# Patient Record
Sex: Male | Born: 1940 | Race: White | Hispanic: No | Marital: Married | Smoking: Never smoker
Health system: Southern US, Community
[De-identification: ages and names within clinical notes are randomized; demographics above are authoritative.]

## PROBLEM LIST (undated history)

## (undated) DIAGNOSIS — I714 Abdominal aortic aneurysm, without rupture, unspecified: Secondary | ICD-10-CM

## (undated) DIAGNOSIS — H5350 Unspecified color vision deficiencies: Secondary | ICD-10-CM

## (undated) DIAGNOSIS — E785 Hyperlipidemia, unspecified: Secondary | ICD-10-CM

## (undated) DIAGNOSIS — I2510 Atherosclerotic heart disease of native coronary artery without angina pectoris: Secondary | ICD-10-CM

## (undated) DIAGNOSIS — I10 Essential (primary) hypertension: Secondary | ICD-10-CM

## (undated) DIAGNOSIS — M1990 Unspecified osteoarthritis, unspecified site: Secondary | ICD-10-CM

## (undated) DIAGNOSIS — K219 Gastro-esophageal reflux disease without esophagitis: Secondary | ICD-10-CM

## (undated) DIAGNOSIS — H269 Unspecified cataract: Secondary | ICD-10-CM

## (undated) DIAGNOSIS — I639 Cerebral infarction, unspecified: Secondary | ICD-10-CM

## (undated) DIAGNOSIS — C801 Malignant (primary) neoplasm, unspecified: Secondary | ICD-10-CM

## (undated) HISTORY — PX: EYE SURGERY: SHX253

## (undated) HISTORY — PX: OTHER SURGICAL HISTORY: SHX169

## (undated) HISTORY — PX: COLONOSCOPY W/ BIOPSIES: SHX1374

---

## 2007-03-18 DIAGNOSIS — I209 Angina pectoris, unspecified: Secondary | ICD-10-CM

## 2007-03-18 HISTORY — DX: Angina pectoris, unspecified: I20.9

## 2015-03-18 HISTORY — PX: CATARACT EXTRACTION, BILATERAL: SHX1313

## 2020-01-09 ENCOUNTER — Other Ambulatory Visit: Payer: Self-pay

## 2020-01-09 ENCOUNTER — Encounter: Payer: Self-pay | Admitting: Vascular Surgery

## 2020-01-09 ENCOUNTER — Ambulatory Visit (INDEPENDENT_AMBULATORY_CARE_PROVIDER_SITE_OTHER): Payer: Medicare Other | Admitting: Vascular Surgery

## 2020-01-09 VITALS — BP 131/80 | HR 67 | Temp 98.4°F | Resp 16 | Ht 69.0 in | Wt 174.0 lb

## 2020-01-09 DIAGNOSIS — I714 Abdominal aortic aneurysm, without rupture, unspecified: Secondary | ICD-10-CM

## 2020-01-09 NOTE — Progress Notes (Signed)
Vascular and Vein Specialist of [place]  Patient name: Quintin Hjort MRN: [id number] DOB: Jun 29, 1940 Sex: male  REASON FOR CONSULT: Evaluate for recent incidental finding of infrarenal abdominal aortic aneurysm  HPI: Wheeler Incorvaia is a 79 y.o. male, here today for discussion of incidental finding of abdominal aortic aneurysm.  He is here today with his wife and son.  He underwent MRI for evaluation of low back pain in [place] on 01/02/2020.  This did show degenerative disc disease.  Also showed a 5.3 infrarenal abdominal aortic aneurysm.  He had no prior knowledge of this and is here today for discussion of this.  He has no family history of aneurysmal disease.  He is a prior cigarette smoker but stopped many years ago.  Does have a prior history of coronary artery stenting over 20 years ago and has been stable since then.  He does have hypertension which is controlled with medication.  History reviewed. No pertinent past medical history.  History reviewed. No pertinent family history.  SOCIAL HISTORY: Social History   Socioeconomic History  . Marital status: Married    Spouse name: Not on file  . Number of children: Not on file  . Years of education: Not on file  . Highest education level: Not on file  Occupational History  . Not on file  Tobacco Use  . Smoking status: Never Smoker  . Smokeless tobacco: Never Used  Substance and Sexual Activity  . Alcohol use: Not Currently  . Drug use: Never  . Sexual activity: Not on file  Other Topics Concern  . Not on file  Social History Narrative  . Not on file   Social Determinants of Health   Financial Resource Strain:   . Difficulty of Paying Living Expenses: Not on file  Food Insecurity:   . Worried About Charity fundraiser in the Last Year: Not on file  . Ran Out of Food in the Last Year: Not on file  Transportation Needs:   . Lack of Transportation (Medical): Not on file  . Lack of Transportation  (Non-Medical): Not on file  Physical Activity:   . Days of Exercise per Week: Not on file  . Minutes of Exercise per Session: Not on file  Stress:   . Feeling of Stress : Not on file  Social Connections:   . Frequency of Communication with Friends and Family: Not on file  . Frequency of Social Gatherings with Friends and Family: Not on file  . Attends Religious Services: Not on file  . Active Member of Clubs or Organizations: Not on file  . Attends Archivist Meetings: Not on file  . Marital Status: Not on file  Intimate Partner Violence:   . Fear of Current or Ex-Partner: Not on file  . Emotionally Abused: Not on file  . Physically Abused: Not on file  . Sexually Abused: Not on file    No Known Allergies  Current Outpatient Medications  Medication Sig Dispense Refill  . aspirin 81 MG EC tablet Take by mouth.    . carvedilol (COREG) 25 MG tablet Take by mouth.    . losartan (COZAAR) 100 MG tablet Take 1 tablet by mouth daily.    . magnesium oxide (MAG-OX) 400 MG tablet Take by mouth.    . pantoprazole (PROTONIX) 40 MG tablet Take 1 tablet by mouth daily.    . pravastatin (PRAVACHOL) 40 MG tablet Take 1 tablet by mouth daily.  No current facility-administered medications for this visit.    REVIEW OF SYSTEMS:  Negative except as noted in history of present illness and past history              Vitals:   01/09/20 1002  BP: 131/80  Pulse: 67  Resp: 16  Temp: 98.4 F (36.9 C)  TempSrc: Other (Comment)  SpO2: 97%  Weight: 174 lb (78.9 kg)  Height: 5\' 9"  (1.753 m)    GENERAL: The patient is a well-nourished male, in no acute distress. The vital signs are documented above. CARDIAC: There is a regular rate and rhythm.  VASCULAR: Carotid arteries without bruits bilaterally.  2+ radial pulses bilaterally 2+ femoral pulses bilaterally 3+ popliteal pulses bilaterally with no evidence of aneurysm and 2+ dorsalis pedis pulses bilaterally PULMONARY: There is good  air exchange bilaterally without wheezing or rales. ABDOMEN: Soft and non-tender with normal pitched bowel sounds.  I cannot feel aortic pulsation.  Nontender MUSCULOSKELETAL: There are no major deformities or cyanosis. NEUROLOGIC: No focal weakness or paresthesias are detected. SKIN: There are no ulcers or rashes noted. PSYCHIATRIC: The patient has a normal affect.  DATA:   I do not have his actual films but do have his MRI report.  This showed a maximal diameter of 5.3 cm.  There is reportedly 3.5 cm of infrarenal aorta prior to initiation of the aneurysm  MEDICAL ISSUES:  I had a very long discussion with the patient and his family present.  I explained that this is an incidental finding and is not responsible for his back pain.  Also explained the potential risk for rupture and recommendation for treatment to prevent rupture.  I explained that typically treatment is recommended in the 5.0 to 5.5 cm range.  I discussed the details of open abdominal surgery and endovascular stent graft repair and advantages of each.  I have recommended CT angiogram for further evaluation and preoperative planning.  We will obtain this as an outpatient at [facility] and then see him back in the office for continued discussion.  I explained that he does not need to limit his activities and would recommend standard blood pressure control as we plan for surgery.  If he has indication for surgery, he will require preoperative cardiac clearance.  We will coordinate this as well   Curt Jews Vascular and Vein Specialists of [employer] phone [phone]

## 2020-01-10 ENCOUNTER — Other Ambulatory Visit: Payer: Self-pay

## 2020-01-10 DIAGNOSIS — I714 Abdominal aortic aneurysm, without rupture, unspecified: Secondary | ICD-10-CM

## 2020-01-13 ENCOUNTER — Other Ambulatory Visit: Payer: Self-pay

## 2020-01-13 ENCOUNTER — Ambulatory Visit (HOSPITAL_COMMUNITY)
Admission: RE | Admit: 2020-01-13 | Discharge: 2020-01-13 | Disposition: A | Discharge status: Home / Self Care | Payer: Medicare Other | Source: Ambulatory Visit | Attending: Vascular Surgery | Admitting: Vascular Surgery

## 2020-01-13 DIAGNOSIS — I714 Abdominal aortic aneurysm, without rupture, unspecified: Secondary | ICD-10-CM

## 2020-01-13 LAB — POCT I-STAT CREATININE: Creatinine, Ser: 1 mg/dL (ref 0.61–1.24)

## 2020-01-13 MED ORDER — IOHEXOL 350 MG/ML SOLN
100.0000 mL | Freq: Once | INTRAVENOUS | Status: AC | PRN
  Administered 2020-01-13: 100 mL via INTRAVENOUS

## 2020-01-16 ENCOUNTER — Encounter: Payer: Self-pay | Admitting: Vascular Surgery

## 2020-01-16 ENCOUNTER — Other Ambulatory Visit: Payer: Self-pay

## 2020-01-16 ENCOUNTER — Ambulatory Visit (INDEPENDENT_AMBULATORY_CARE_PROVIDER_SITE_OTHER): Payer: Medicare Other | Admitting: Vascular Surgery

## 2020-01-16 VITALS — BP 145/82 | HR 60 | Temp 98.6°F | Resp 14 | Ht 69.0 in | Wt 174.0 lb

## 2020-01-16 DIAGNOSIS — I714 Abdominal aortic aneurysm, without rupture, unspecified: Secondary | ICD-10-CM

## 2020-01-16 NOTE — Progress Notes (Signed)
Vascular and Vein Specialist of [place]  Patient name: George Walsh MRN: [id number] DOB: 12-Nov-1940 Sex: male  REASON FOR VISIT: Continued discussion of abdominal aortic aneurysm  HPI: George Walsh is a 79 y.o. male here today for continued discussion.  He was seen several weeks ago.  He underwent MRI for back pain and had incidental finding of a moderate size infrarenal abdominal arctic aneurysm.  He has undergone CT angiogram of his abdomen and pelvis and is here today for further discussion.  He is here today with his wife and a second son who was not present on his first visit.  He remains asymptomatic  History reviewed. No pertinent past medical history.  History reviewed. No pertinent family history.  SOCIAL HISTORY: Social History   Tobacco Use  . Smoking status: Never Smoker  . Smokeless tobacco: Never Used  Substance Use Topics  . Alcohol use: Not Currently    No Known Allergies  Current Outpatient Medications  Medication Sig Dispense Refill  . aspirin 81 MG EC tablet Take by mouth.    . carvedilol (COREG) 25 MG tablet Take by mouth.    . losartan (COZAAR) 100 MG tablet Take 1 tablet by mouth daily.    . magnesium oxide (MAG-OX) 400 MG tablet Take by mouth.    . pantoprazole (PROTONIX) 40 MG tablet Take 1 tablet by mouth daily.    . pravastatin (PRAVACHOL) 40 MG tablet Take 1 tablet by mouth daily.     No current facility-administered medications for this visit.    REVIEW OF SYSTEMS:  [X]  denotes positive finding, [ ]  denotes negative finding Cardiac  Comments:  Chest pain or chest pressure:    Shortness of breath upon exertion:    Short of breath when lying flat:    Irregular heart rhythm:        Vascular    Pain in calf, thigh, or hip brought on by ambulation:    Pain in feet at night that wakes you up from your sleep:     Blood clot in your veins:    Leg swelling:           PHYSICAL EXAM: Vitals:   01/16/20  1301  BP: (!) 145/82  Pulse: 60  Resp: 14  Temp: 98.6 F (37 C)  TempSrc: Other (Comment)  SpO2: 97%  Weight: 174 lb (78.9 kg)  Height: 5\' 9"  (1.753 m)    GENERAL: The patient is a well-nourished male, in no acute distress. The vital signs are documented above.  DATA:  CT scan was reviewed and discussed in detail with patient and his family.  We reviewed the actual CT images in the axial, sagittal and coronal views.  He does have a 5.9 cm infrarenal abdominal aortic aneurysm.  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  He has a very nice long infrarenal normal aortic neck prior to initiation of his aneurysm  MEDICAL ISSUES: I again discussed the indications for repair.  He does appear to be an excellent candidate for stent graft repair.  I explained various options for addressing the ectasia and small saccular component in his right common iliac artery.  I explained that we will review this with the device manufacturer to develop a plan for repair.  Explained that he would have surgery at [facility] under general anesthesia with a puncture in each side.  I did explain the potential  for injury to the approach vessels with potential need for cutdown and repair.  Also explained the very slight risk for cardiac and renal dysfunction.  We will coordinate cardiology evaluation with his cardiologist in [facility], Dr. Alroy Dust and plan with seating with surgery following this.    Rosetta Posner, MD FACS Vascular and Vein Specialists of [facility] Tel [phone]

## 2020-01-16 NOTE — H&P (View-Only) (Signed)
Vascular and Vein Specialist of [place]  Patient name: George Walsh MRN: [id number] DOB: 1940/05/07 Sex: male  REASON FOR VISIT: Continued discussion of abdominal aortic aneurysm  HPI: George Walsh is a 79 y.o. male here today for continued discussion.  He was seen several weeks ago.  He underwent MRI for back pain and had incidental finding of a moderate size infrarenal abdominal arctic aneurysm.  He has undergone CT angiogram of his abdomen and pelvis and is here today for further discussion.  He is here today with his wife and a second son who was not present on his first visit.  He remains asymptomatic  History reviewed. No pertinent past medical history.  History reviewed. No pertinent family history.  SOCIAL HISTORY: Social History   Tobacco Use  . Smoking status: Never Smoker  . Smokeless tobacco: Never Used  Substance Use Topics  . Alcohol use: Not Currently    No Known Allergies  Current Outpatient Medications  Medication Sig Dispense Refill  . aspirin 81 MG EC tablet Take by mouth.    . carvedilol (COREG) 25 MG tablet Take by mouth.    . losartan (COZAAR) 100 MG tablet Take 1 tablet by mouth daily.    . magnesium oxide (MAG-OX) 400 MG tablet Take by mouth.    . pantoprazole (PROTONIX) 40 MG tablet Take 1 tablet by mouth daily.    . pravastatin (PRAVACHOL) 40 MG tablet Take 1 tablet by mouth daily.     No current facility-administered medications for this visit.    REVIEW OF SYSTEMS:  [X]  denotes positive finding, [ ]  denotes negative finding Cardiac  Comments:  Chest pain or chest pressure:    Shortness of breath upon exertion:    Short of breath when lying flat:    Irregular heart rhythm:        Vascular    Pain in calf, thigh, or hip brought on by ambulation:    Pain in feet at night that wakes you up from your sleep:     Blood clot in your veins:    Leg swelling:           PHYSICAL EXAM: Vitals:   01/16/20  1301  BP: (!) 145/82  Pulse: 60  Resp: 14  Temp: 98.6 F (37 C)  TempSrc: Other (Comment)  SpO2: 97%  Weight: 174 lb (78.9 kg)  Height: 5\' 9"  (1.753 m)    GENERAL: The patient is a well-nourished male, in no acute distress. The vital signs are documented above.  DATA:  CT scan was reviewed and discussed in detail with patient and his family.  We reviewed the actual CT images in the axial, sagittal and coronal views.  He does have a 5.9 cm infrarenal abdominal aortic aneurysm.  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  He has a very nice long infrarenal normal aortic neck prior to initiation of his aneurysm  MEDICAL ISSUES: I again discussed the indications for repair.  He does appear to be an excellent candidate for stent graft repair.  I explained various options for addressing the ectasia and small saccular component in his right common iliac artery.  I explained that we will review this with the device manufacturer to develop a plan for repair.  Explained that he would have surgery at [facility] under general anesthesia with a puncture in each side.  I did explain the potential  for injury to the approach vessels with potential need for cutdown and repair.  Also explained the very slight risk for cardiac and renal dysfunction.  We will coordinate cardiology evaluation with his cardiologist in [facility], Dr. Alroy Dust and plan with seating with surgery following this.    Rosetta Posner, MD FACS Vascular and Vein Specialists of [facility] Tel [phone]

## 2020-01-17 ENCOUNTER — Encounter: Payer: Self-pay | Admitting: Vascular Surgery

## 2020-01-18 ENCOUNTER — Other Ambulatory Visit: Payer: Self-pay

## 2020-01-18 NOTE — Progress Notes (Signed)
Surgical clearance received from [facility]. Pt has been cleared to proceed with AAA repair and is no longer on Plavix. Pt has been scheduled for 01/27/20.

## 2020-01-19 ENCOUNTER — Telehealth: Payer: Self-pay

## 2020-01-19 NOTE — Telephone Encounter (Signed)
Patients wife called to ask where to go for PAT and to inquire about the waiting area. Answered her questions. She verbalized understanding.

## 2020-01-23 ENCOUNTER — Encounter (HOSPITAL_COMMUNITY)
Admission: RE | Admit: 2020-01-23 | Discharge: 2020-01-23 | Disposition: A | Discharge status: Home / Self Care | Payer: Medicare Other | Source: Ambulatory Visit | Attending: Vascular Surgery | Admitting: Vascular Surgery

## 2020-01-23 ENCOUNTER — Other Ambulatory Visit (HOSPITAL_COMMUNITY)
Admission: RE | Admit: 2020-01-23 | Discharge: 2020-01-23 | Disposition: A | Discharge status: Home / Self Care | Payer: Medicare Other | Source: Ambulatory Visit | Attending: Vascular Surgery | Admitting: Vascular Surgery

## 2020-01-23 ENCOUNTER — Other Ambulatory Visit: Payer: Self-pay

## 2020-01-23 ENCOUNTER — Encounter (HOSPITAL_COMMUNITY): Payer: Self-pay

## 2020-01-23 DIAGNOSIS — I10 Essential (primary) hypertension: Secondary | ICD-10-CM | POA: Insufficient documentation

## 2020-01-23 DIAGNOSIS — Z85828 Personal history of other malignant neoplasm of skin: Secondary | ICD-10-CM | POA: Insufficient documentation

## 2020-01-23 DIAGNOSIS — Z20822 Contact with and (suspected) exposure to covid-19: Secondary | ICD-10-CM | POA: Insufficient documentation

## 2020-01-23 DIAGNOSIS — Z791 Long term (current) use of non-steroidal anti-inflammatories (NSAID): Secondary | ICD-10-CM | POA: Insufficient documentation

## 2020-01-23 DIAGNOSIS — Z7982 Long term (current) use of aspirin: Secondary | ICD-10-CM | POA: Insufficient documentation

## 2020-01-23 DIAGNOSIS — E785 Hyperlipidemia, unspecified: Secondary | ICD-10-CM | POA: Insufficient documentation

## 2020-01-23 DIAGNOSIS — I2510 Atherosclerotic heart disease of native coronary artery without angina pectoris: Secondary | ICD-10-CM | POA: Insufficient documentation

## 2020-01-23 DIAGNOSIS — H5350 Unspecified color vision deficiencies: Secondary | ICD-10-CM | POA: Insufficient documentation

## 2020-01-23 DIAGNOSIS — Z955 Presence of coronary angioplasty implant and graft: Secondary | ICD-10-CM | POA: Insufficient documentation

## 2020-01-23 DIAGNOSIS — Z8673 Personal history of transient ischemic attack (TIA), and cerebral infarction without residual deficits: Secondary | ICD-10-CM | POA: Insufficient documentation

## 2020-01-23 DIAGNOSIS — Z01818 Encounter for other preprocedural examination: Secondary | ICD-10-CM | POA: Insufficient documentation

## 2020-01-23 DIAGNOSIS — K219 Gastro-esophageal reflux disease without esophagitis: Secondary | ICD-10-CM | POA: Insufficient documentation

## 2020-01-23 DIAGNOSIS — Z79899 Other long term (current) drug therapy: Secondary | ICD-10-CM | POA: Insufficient documentation

## 2020-01-23 DIAGNOSIS — I714 Abdominal aortic aneurysm, without rupture: Secondary | ICD-10-CM | POA: Insufficient documentation

## 2020-01-23 HISTORY — DX: Abdominal aortic aneurysm, without rupture, unspecified: I71.40

## 2020-01-23 HISTORY — DX: Malignant (primary) neoplasm, unspecified: C80.1

## 2020-01-23 HISTORY — DX: Unspecified cataract: H26.9

## 2020-01-23 HISTORY — DX: Essential (primary) hypertension: I10

## 2020-01-23 HISTORY — DX: Gastro-esophageal reflux disease without esophagitis: K21.9

## 2020-01-23 HISTORY — DX: Abdominal aortic aneurysm, without rupture: I71.4

## 2020-01-23 HISTORY — DX: Atherosclerotic heart disease of native coronary artery without angina pectoris: I25.10

## 2020-01-23 HISTORY — DX: Hyperlipidemia, unspecified: E78.5

## 2020-01-23 HISTORY — DX: Unspecified color vision deficiencies: H53.50

## 2020-01-23 HISTORY — DX: Unspecified osteoarthritis, unspecified site: M19.90

## 2020-01-23 HISTORY — DX: Cerebral infarction, unspecified: I63.9

## 2020-01-23 LAB — CBC
HCT: 40.6 % (ref 39.0–52.0)
Hemoglobin: 13.4 g/dL (ref 13.0–17.0)
MCH: 30.5 pg (ref 26.0–34.0)
MCHC: 33 g/dL (ref 30.0–36.0)
MCV: 92.3 fL (ref 80.0–100.0)
Platelets: 279 10*3/uL (ref 150–400)
RBC: 4.4 MIL/uL (ref 4.22–5.81)
RDW: 13.2 % (ref 11.5–15.5)
WBC: 7.1 10*3/uL (ref 4.0–10.5)
nRBC: 0 % (ref 0.0–0.2)

## 2020-01-23 LAB — URINALYSIS, ROUTINE W REFLEX MICROSCOPIC
Bilirubin Urine: NEGATIVE
Glucose, UA: NEGATIVE mg/dL
Hgb urine dipstick: NEGATIVE
Ketones, ur: NEGATIVE mg/dL
Leukocytes,Ua: NEGATIVE
Nitrite: NEGATIVE
Protein, ur: NEGATIVE mg/dL
Specific Gravity, Urine: 1.024 (ref 1.005–1.030)
pH: 5 (ref 5.0–8.0)

## 2020-01-23 LAB — COMPREHENSIVE METABOLIC PANEL
ALT: 19 U/L (ref 0–44)
AST: 21 U/L (ref 15–41)
Albumin: 3.7 g/dL (ref 3.5–5.0)
Alkaline Phosphatase: 49 U/L (ref 38–126)
Anion gap: 9 (ref 5–15)
BUN: 20 mg/dL (ref 8–23)
CO2: 25 mmol/L (ref 22–32)
Calcium: 9.3 mg/dL (ref 8.9–10.3)
Chloride: 108 mmol/L (ref 98–111)
Creatinine, Ser: 1.27 mg/dL — ABNORMAL HIGH (ref 0.61–1.24)
GFR, Estimated: 59 mL/min — ABNORMAL LOW (ref >60)
Glucose, Bld: 84 mg/dL (ref 70–99)
Potassium: 3.8 mmol/L (ref 3.5–5.1)
Sodium: 142 mmol/L (ref 135–145)
Total Bilirubin: 1 mg/dL (ref 0.3–1.2)
Total Protein: 6.7 g/dL (ref 6.5–8.1)

## 2020-01-23 LAB — SURGICAL PCR SCREEN
MRSA, PCR: NEGATIVE
Staphylococcus aureus: POSITIVE — AB

## 2020-01-23 LAB — TYPE AND SCREEN
ABO/RH(D): O POS
Antibody Screen: NEGATIVE

## 2020-01-23 LAB — APTT: aPTT: 27 seconds (ref 24–36)

## 2020-01-23 LAB — SARS CORONAVIRUS 2 (TAT 6-24 HRS): SARS Coronavirus 2: NEGATIVE

## 2020-01-23 LAB — PROTIME-INR
INR: 1.2 (ref 0.8–1.2)
Prothrombin Time: 14.5 seconds (ref 11.4–15.2)

## 2020-01-23 NOTE — Progress Notes (Signed)
[id number] [place] [address] [place] [address] Phone: [phone] Fax: [phone]      Your procedure is scheduled on Friday November 12th.  Report to [facility] Main Entrance "A" at 5:30 A.M., and check in at the Admitting office.  Call this number if you have problems the morning of surgery:  [phone]  Call [phone] if you have any questions prior to your surgery date Monday-Friday 8am-4pm    Remember:  Do not eat or drink anything after midnight the night before your surgery    Take these medicines the morning of surgery with A SIP OF WATER   carvedilol (COREG) 25 MG tablet  pantoprazole (PROTONIX) 40 MG tablet  pravastatin (PRAVACHOL) 40 MG tablet   As of today, STOP taking any Aspirin (unless otherwise instructed by your surgeon) Aleve, Naproxen, Ibuprofen, Mobic Motrin, Advil, Goody's, BC's, all herbal medications, fish oil, and all vitamins.                      Do not wear jewelry            Do not wear lotions, powders, colognes, or deodorant.            Do not shave 48 hours prior to surgery.  Men may shave face and neck.            Do not bring valuables to the hospital.            [facility] is not responsible for any belongings or valuables.  Do NOT Smoke (Tobacco/Vaping) or drink Alcohol 24 hours prior to your procedure If you use a CPAP at night, you may bring all equipment for your overnight stay.   Contacts, glasses, dentures or bridgework may not be worn into surgery.      For patients admitted to the hospital, discharge time will be determined by your treatment team.   Patients discharged the day of surgery will not be allowed to drive home, and someone needs to stay with them for 24 hours.    Special instructions:   [place]- Preparing For Surgery  Before surgery, you can play an important role. Because skin is not sterile, your skin needs to be as free of  germs as possible. You can reduce the number of germs on your skin by washing with CHG (chlorahexidine gluconate) Soap before surgery.  CHG is an antiseptic cleaner which kills germs and bonds with the skin to continue killing germs even after washing.    Oral Hygiene is also important to reduce your risk of infection.  Remember - BRUSH YOUR TEETH THE MORNING OF SURGERY WITH YOUR REGULAR TOOTHPASTE  Please do not use if you have an allergy to CHG or antibacterial soaps. If your skin becomes reddened/irritated stop using the CHG.  Do not shave (including legs and underarms) for at least 48 hours prior to first CHG shower. It is OK to shave your face.  Please follow these instructions carefully.   1. Shower the NIGHT BEFORE SURGERY and the MORNING OF SURGERY with CHG Soap.   2. If you chose to wash your hair, wash your hair first as usual with your normal shampoo.  3. After you shampoo, rinse your hair and body thoroughly to remove the shampoo.  4. Use CHG as you would any other liquid soap. You can apply CHG directly to the skin and wash gently  with a scrungie or a clean washcloth.   5. Apply the CHG Soap to your body ONLY FROM THE NECK DOWN.  Do not use on open wounds or open sores. Avoid contact with your eyes, ears, mouth and genitals (private parts). Wash Face and genitals (private parts)  with your normal soap.   6. Wash thoroughly, paying special attention to the area where your surgery will be performed.  7. Thoroughly rinse your body with warm water from the neck down.  8. DO NOT shower/wash with your normal soap after using and rinsing off the CHG Soap.  9. Pat yourself dry with a CLEAN TOWEL.  10. Wear CLEAN PAJAMAS to bed the night before surgery  11. Place CLEAN SHEETS on your bed the night of your first shower and DO NOT SLEEP WITH PETS.   Day of Surgery: Wear Clean/Comfortable clothing the morning of surgery Do not apply any deodorants/lotions.   Remember to brush your  teeth WITH YOUR REGULAR TOOTHPASTE.   Please read over the following fact sheets that you were given.

## 2020-01-23 NOTE — Progress Notes (Signed)
PCP - Dr. Lebron Conners with [facility] in [place], [place] Cardiologist - Dr. Milon Dikes   Chest x-ray - Not indicated EKG - 01/23/20 Stress Test - 11/28/09 ECHO - 11/29/09 Cardiac Cath - Stent place in 2009  Sleep Study - Denies OSA  DM - Denies  Aspirin Instructions: Aspirin 81mg  will continue having vascular surgery  COVID TEST- 01/23/20  Anesthesia review: Yes cardiac history  Patient denies shortness of breath, fever, cough and chest pain at PAT appointment   All instructions explained to the patient, with a verbal understanding of the material. Patient agrees to go over the instructions while at home for a better understanding. Patient also instructed to self quarantine after being tested for COVID-19. The opportunity to ask questions was provided.

## 2020-01-24 ENCOUNTER — Encounter (HOSPITAL_COMMUNITY): Payer: Self-pay

## 2020-01-24 NOTE — Progress Notes (Addendum)
Anesthesia Chart Review:   Case: [id number] Date/Time: 01/27/20 0715   Procedure: ABDOMINAL AORTIC ENDOVASCULAR STENT GRAFT (N/A )   Anesthesia type: General   Pre-op diagnosis: AAA   Location: [facility] OR ROOM 10 / [facility]   Surgeons: Rosetta Posner, MD      DISCUSSION: Patient is a 79 year old male scheduled for the above procedure.  History includes never smoker, AAA, CAD (DES pLAD, severe DIAG stenosis not amenabale to PCI given small size, occluded RCA with left-to-right collaterals, unsuccessful attempt at PCI 11/05/07), HTN, HLD, GERD, skin cancer, CVA, color blind.  Patient to continue ASA per [facility]. Dr. Donnetta Hutching is requesting cardiac clearance from Dr. Rosalita Chessman. Awaiting records.   01/23/2020 presurgical COVID-19 test negative.  ADDENDUM 01/26/20 11:48 AM: Records reviewed from [facility]. Last visit with Dr. Rosalita Chessman was 11/08/19. Last stress echo 2016. Also received clearance letter from Dr. Rosalita Chessman that was sent to Dr. Donnetta Hutching stating,  "This is to inform you that the above mentioned patient is followed by me and has been cleared for his upcoming surgery for AAA repair.  He is no longer on Plavix.  You may proceed with the surgery."   VS: BP 128/82   Pulse (!) 59   Temp 36.9 C (Oral)   Resp 18   Ht 5\' 9"  (1.753 m)   Wt 79.2 kg   SpO2 99%   BMI 25.77 kg/m    PROVIDERS: Byrd Hesselbach, MD is listed as PCP, but reported as Gerre Scull, MD [facility]) Lona Kettle, MD is cardiologist Mindi Junker)   LABS: Labs reviewed: Acceptable for surgery. (all labs ordered are listed, but only abnormal results are displayed)  Labs Reviewed  SURGICAL PCR SCREEN - Abnormal; Notable for the following components:      Result Value   Staphylococcus aureus POSITIVE (*)    All other components within normal limits  COMPREHENSIVE METABOLIC PANEL - Abnormal; Notable for the following components:   Creatinine, Ser 1.27 (*)    GFR, Estimated 59 (*)    All other components within  normal limits  APTT  CBC  PROTIME-INR  URINALYSIS, ROUTINE W REFLEX MICROSCOPIC  TYPE AND SCREEN     IMAGES: CTA abd/pelvis 01/13/20: IMPRESSION: VASCULAR 1. Infrarenal abdominal aortic aneurysm measuring up to 5.9 cm. Aortic aneurysm NOS (ICD10-I71.9). Aortic Atherosclerosis (ICD10-I70.0). 2. 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 3. 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 There is additional mild narrowing of the SMA ostium. 4. 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  NON-VASCULAR 1. Mild prostatomegaly. 2. Tiny fat containing umbilical hernia.   EKG: 01/23/20: Sinus bradycardia at 57 bpm Left axis deviation Abnormal ECG No old tracing to compare Confirmed by Charolette Forward ([id number]) on 01/23/2020 4:43:56 PM   CV: Stress echo 07/28/14 ([facility]): Impressions: This is a negative stress echo with no ischemia at workload achieved.  There was 1 mm ST segment depression on EKG and no new SWMA on echo post-exercise.  Overall ejection fraction of 50%.  Echo 11/29/09 ([facility]): Report not viewable in [place]. Per 12/03/09 telephone encounter, "His echo was good. Heart function and valves ok"  Nuclear stress test 11/28/09 ([facility]): IMPRESSION: NORMAL Study.  Normal study. EF 70%  Cardiac cath 11/05/07 [facility]) Per Discharge Summary in Care Everywhere: 1. Normal LV systolic function with LVEF of 60% and normal wall motion.  2. Severe 2 vessel CAD, with 85% stenosis of large proximal LAD. There is a small 1st diagonal branch which has severe proximal stenosis, but is too small for PCI.  3. Chronic total occlusion of mid RCA. Extensive  collateral filling of distal RCA by left to right and right to right collaterals. The PDA is small and not amenable to bypass surgery.  4. Unsuccessful attempt to cross CTO of mid RCA with wire. No PTCA done.  5. Successful PCI with placement of a 4.0x85mm Promus DES in proximal LAD. Will need plavix for minimum of 1 year. Continue aggressive medical therapy for risk reduction He has done well with no further CP. Groin without bleeding or hematoma. I have talked with pt and family. He has chronically occluded RCA which puts him at no significant new risk as it is well collateralized. It may however give him occasional angina which he should expect. I have given him instructions to return to ED if he has severe pain.   Past Medical History:  Diagnosis Date  . AAA (abdominal aortic aneurysm) ([place])   . Arthritis   . Cancer ([place])    skin cancer  . Cataract   . Color blind   . Coronary artery disease    11/05/07 [facility]): DES pLAD, severe DIAG stenosis not amenabale to PCI given small size, occluded RCA with left-to-right collaterals, unsuccessful attempt at PCI  . GERD (gastroesophageal reflux disease)   . Hyperlipidemia   . Hypertension   . Stroke [facility])     Past Surgical History:  Procedure Laterality Date  . CATARACT EXTRACTION, BILATERAL Bilateral 2017  . COLONOSCOPY W/ BIOPSIES    . EYE SURGERY Bilateral    cataracts  . OTHER SURGICAL HISTORY     stent placed unsure exactly where  . OTHER SURGICAL HISTORY     Stent - BMS single    MEDICATIONS: . aspirin 81 MG EC tablet  . carvedilol (COREG) 25 MG tablet  . losartan (COZAAR) 100 MG tablet  . magnesium oxide (MAG-OX) 400 MG tablet  . meloxicam (MOBIC) 7.5 MG tablet  . pantoprazole (PROTONIX) 40 MG tablet  . pravastatin (PRAVACHOL) 40 MG tablet  . SF 5000 PLUS 1.1 % CREA dental cream   No current facility-administered medications for this encounter.    Myra Gianotti, PA-C Surgical Short Stay/Anesthesiology [facility] Phone  [phone] [facility] Phone [phone] 01/24/2020 3:48 PM

## 2020-01-26 ENCOUNTER — Telehealth: Payer: Self-pay

## 2020-01-26 NOTE — Telephone Encounter (Signed)
Patient's wife called with questions about patient's surgery. Answered to her satisfaction.

## 2020-01-26 NOTE — Anesthesia Preprocedure Evaluation (Addendum)
Anesthesia Evaluation  Patient identified by MRN, date of birth, ID band Patient awake    Reviewed: Allergy & Precautions, NPO status , Patient's Chart, lab work & pertinent test results, reviewed documented beta blocker date and time   Airway Mallampati: II  TM Distance: >3 FB Neck ROM: Full    Dental  (+) Dental Advisory Given   Pulmonary neg pulmonary ROS,    breath sounds clear to auscultation       Cardiovascular hypertension, Pt. on medications and Pt. on home beta blockers + CAD, + Cardiac Stents and + Peripheral Vascular Disease   Rhythm:Regular Rate:Normal     Neuro/Psych CVA    GI/Hepatic Neg liver ROS, GERD  ,  Endo/Other  negative endocrine ROS  Renal/GU Renal InsufficiencyRenal disease     Musculoskeletal   Abdominal   Peds  Hematology negative hematology ROS (+)   Anesthesia Other Findings   Reproductive/Obstetrics                             Lab Results  Component Value Date   WBC 7.1 01/23/2020   HGB 13.4 01/23/2020   HCT 40.6 01/23/2020   MCV 92.3 01/23/2020   PLT 279 01/23/2020   Lab Results  Component Value Date   CREATININE 1.27 (H) 01/23/2020   BUN 20 01/23/2020   NA 142 01/23/2020   K 3.8 01/23/2020   CL 108 01/23/2020   CO2 25 01/23/2020    Anesthesia Physical Anesthesia Plan  ASA: III  Anesthesia Plan: General   Post-op Pain Management:    Induction: Intravenous  PONV Risk Score and Plan: 2 and Dexamethasone, Ondansetron and Treatment may vary due to age or medical condition  Airway Management Planned: Oral ETT  Additional Equipment: Arterial line  Intra-op Plan:   Post-operative Plan: Extubation in OR  Informed Consent: I have reviewed the patients History and Physical, chart, labs and discussed the procedure including the risks, benefits and alternatives for the proposed anesthesia with the patient or authorized representative who has  indicated his/her understanding and acceptance.     Dental advisory given  Plan Discussed with: CRNA  Anesthesia Plan Comments: ( )      Anesthesia Quick Evaluation

## 2020-01-27 ENCOUNTER — Inpatient Hospital Stay (HOSPITAL_COMMUNITY): Payer: Medicare Other | Admitting: Vascular Surgery

## 2020-01-27 ENCOUNTER — Inpatient Hospital Stay (HOSPITAL_COMMUNITY): Payer: Medicare Other

## 2020-01-27 ENCOUNTER — Encounter (HOSPITAL_COMMUNITY): Payer: Self-pay | Admitting: Vascular Surgery

## 2020-01-27 ENCOUNTER — Encounter (HOSPITAL_COMMUNITY)
Admission: RE | Disposition: A | Discharge status: Home / Self Care | Payer: Self-pay | Source: Home / Self Care | Attending: Vascular Surgery

## 2020-01-27 ENCOUNTER — Inpatient Hospital Stay (HOSPITAL_COMMUNITY): Payer: Medicare Other | Admitting: Anesthesiology

## 2020-01-27 ENCOUNTER — Inpatient Hospital Stay (HOSPITAL_COMMUNITY)
Admission: RE | Admit: 2020-01-27 | Discharge: 2020-01-28 | DRG: 269 | Disposition: A | Discharge status: Home / Self Care | Payer: Medicare Other | Attending: Vascular Surgery | Admitting: Vascular Surgery

## 2020-01-27 ENCOUNTER — Other Ambulatory Visit: Payer: Self-pay

## 2020-01-27 DIAGNOSIS — E785 Hyperlipidemia, unspecified: Secondary | ICD-10-CM | POA: Diagnosis present

## 2020-01-27 DIAGNOSIS — Z955 Presence of coronary angioplasty implant and graft: Secondary | ICD-10-CM

## 2020-01-27 DIAGNOSIS — Z79899 Other long term (current) drug therapy: Secondary | ICD-10-CM

## 2020-01-27 DIAGNOSIS — Y846 Urinary catheterization as the cause of abnormal reaction of the patient, or of later complication, without mention of misadventure at the time of the procedure: Secondary | ICD-10-CM | POA: Diagnosis not present

## 2020-01-27 DIAGNOSIS — S3739XA Other injury of urethra, initial encounter: Secondary | ICD-10-CM | POA: Diagnosis not present

## 2020-01-27 DIAGNOSIS — I2510 Atherosclerotic heart disease of native coronary artery without angina pectoris: Secondary | ICD-10-CM | POA: Diagnosis present

## 2020-01-27 DIAGNOSIS — I714 Abdominal aortic aneurysm, without rupture, unspecified: Secondary | ICD-10-CM | POA: Diagnosis present

## 2020-01-27 DIAGNOSIS — I10 Essential (primary) hypertension: Secondary | ICD-10-CM | POA: Diagnosis present

## 2020-01-27 DIAGNOSIS — Z7982 Long term (current) use of aspirin: Secondary | ICD-10-CM | POA: Diagnosis not present

## 2020-01-27 DIAGNOSIS — Z8673 Personal history of transient ischemic attack (TIA), and cerebral infarction without residual deficits: Secondary | ICD-10-CM | POA: Diagnosis not present

## 2020-01-27 DIAGNOSIS — Z8679 Personal history of other diseases of the circulatory system: Secondary | ICD-10-CM

## 2020-01-27 DIAGNOSIS — K219 Gastro-esophageal reflux disease without esophagitis: Secondary | ICD-10-CM | POA: Diagnosis present

## 2020-01-27 HISTORY — PX: ULTRASOUND GUIDANCE FOR VASCULAR ACCESS: SHX6516

## 2020-01-27 HISTORY — PX: ABDOMINAL AORTIC ENDOVASCULAR STENT GRAFT: SHX5707

## 2020-01-27 HISTORY — PX: CYSTOSCOPY: SHX5120

## 2020-01-27 HISTORY — PX: ARTERY REPAIR: SHX5117

## 2020-01-27 LAB — CBC
HCT: 37.2 % — ABNORMAL LOW (ref 39.0–52.0)
Hemoglobin: 12.6 g/dL — ABNORMAL LOW (ref 13.0–17.0)
MCH: 30.3 pg (ref 26.0–34.0)
MCHC: 33.9 g/dL (ref 30.0–36.0)
MCV: 89.4 fL (ref 80.0–100.0)
Platelets: 236 10*3/uL (ref 150–400)
RBC: 4.16 MIL/uL — ABNORMAL LOW (ref 4.22–5.81)
RDW: 13 % (ref 11.5–15.5)
WBC: 10.7 10*3/uL — ABNORMAL HIGH (ref 4.0–10.5)
nRBC: 0 % (ref 0.0–0.2)

## 2020-01-27 LAB — PROTIME-INR
INR: 1.2 (ref 0.8–1.2)
Prothrombin Time: 14.4 s (ref 11.4–15.2)

## 2020-01-27 LAB — BASIC METABOLIC PANEL
Anion gap: 8 (ref 5–15)
BUN: 23 mg/dL (ref 8–23)
CO2: 24 mmol/L (ref 22–32)
Calcium: 9.1 mg/dL (ref 8.9–10.3)
Chloride: 104 mmol/L (ref 98–111)
Creatinine, Ser: 1.08 mg/dL (ref 0.61–1.24)
GFR, Estimated: >60 mL/min (ref >60)
Glucose, Bld: 133 mg/dL — ABNORMAL HIGH (ref 70–99)
Potassium: 3.9 mmol/L (ref 3.5–5.1)
Sodium: 136 mmol/L (ref 135–145)

## 2020-01-27 LAB — ABO/RH: ABO/RH(D): O POS

## 2020-01-27 LAB — APTT: aPTT: 29 s (ref 24–36)

## 2020-01-27 SURGERY — INSERTION, ENDOVASCULAR STENT GRAFT, AORTA, ABDOMINAL
Anesthesia: General | Site: Penis

## 2020-01-27 MED ORDER — PROTAMINE SULFATE 10 MG/ML IV SOLN
INTRAVENOUS | Status: AC
  Filled 2020-01-27: qty 5

## 2020-01-27 MED ORDER — SODIUM CHLORIDE 0.9 % IV SOLN: 500.0000 mL | Freq: Once | INTRAVENOUS | Status: DC | PRN

## 2020-01-27 MED ORDER — PHENYLEPHRINE 40 MCG/ML (10ML) SYRINGE FOR IV PUSH (FOR BLOOD PRESSURE SUPPORT)
PREFILLED_SYRINGE | INTRAVENOUS | Status: AC
  Filled 2020-01-27: qty 10

## 2020-01-27 MED ORDER — FENTANYL CITRATE (PF) 250 MCG/5ML IJ SOLN
INTRAMUSCULAR | Status: AC
  Filled 2020-01-27: qty 5

## 2020-01-27 MED ORDER — CHLORHEXIDINE GLUCONATE CLOTH 2 % EX PADS: 6.0000 | MEDICATED_PAD | Freq: Once | CUTANEOUS | Status: DC

## 2020-01-27 MED ORDER — PROTAMINE SULFATE 10 MG/ML IV SOLN
INTRAVENOUS | Status: DC | PRN
  Administered 2020-01-27: 50 mg via INTRAVENOUS

## 2020-01-27 MED ORDER — ONDANSETRON HCL 4 MG/2ML IJ SOLN
INTRAMUSCULAR | Status: AC
  Filled 2020-01-27: qty 2

## 2020-01-27 MED ORDER — IODIXANOL 320 MG/ML IV SOLN
INTRAVENOUS | Status: DC | PRN
  Administered 2020-01-27: 118 mL

## 2020-01-27 MED ORDER — ACETAMINOPHEN 500 MG PO TABS
1000.0000 mg | ORAL_TABLET | Freq: Once | ORAL | Status: AC
  Administered 2020-01-27: 1000 mg via ORAL
  Filled 2020-01-27: qty 2

## 2020-01-27 MED ORDER — AMISULPRIDE (ANTIEMETIC) 5 MG/2ML IV SOLN: 10.0000 mg | Freq: Once | INTRAVENOUS | Status: DC | PRN

## 2020-01-27 MED ORDER — SODIUM CHLORIDE 0.9 % IV SOLN: INTRAVENOUS | Status: DC

## 2020-01-27 MED ORDER — HEPARIN SODIUM (PORCINE) 1000 UNIT/ML IJ SOLN
INTRAMUSCULAR | Status: DC | PRN
  Administered 2020-01-27: 7000 [IU] via INTRAVENOUS

## 2020-01-27 MED ORDER — PROPOFOL 10 MG/ML IV BOLUS
INTRAVENOUS | Status: AC
  Filled 2020-01-27: qty 20

## 2020-01-27 MED ORDER — FENTANYL CITRATE (PF) 100 MCG/2ML IJ SOLN: 25.0000 ug | INTRAMUSCULAR | Status: DC | PRN

## 2020-01-27 MED ORDER — PANTOPRAZOLE SODIUM 40 MG PO TBEC
40.0000 mg | DELAYED_RELEASE_TABLET | Freq: Every day | ORAL | Status: DC
  Administered 2020-01-28: 40 mg via ORAL
  Filled 2020-01-27: qty 1

## 2020-01-27 MED ORDER — MAGNESIUM SULFATE 2 GM/50ML IV SOLN: 2.0000 g | Freq: Every day | INTRAVENOUS | Status: DC | PRN

## 2020-01-27 MED ORDER — ROCURONIUM BROMIDE 10 MG/ML (PF) SYRINGE
PREFILLED_SYRINGE | INTRAVENOUS | Status: AC
  Filled 2020-01-27: qty 10

## 2020-01-27 MED ORDER — LIDOCAINE 2% (20 MG/ML) 5 ML SYRINGE
INTRAMUSCULAR | Status: AC
  Filled 2020-01-27: qty 5

## 2020-01-27 MED ORDER — SODIUM CHLORIDE 0.9 % IV SOLN
INTRAVENOUS | Status: AC
  Filled 2020-01-27: qty 1.2

## 2020-01-27 MED ORDER — ONDANSETRON HCL 4 MG/2ML IJ SOLN
4.0000 mg | Freq: Four times a day (QID) | INTRAMUSCULAR | Status: DC | PRN
  Administered 2020-01-27: 4 mg via INTRAVENOUS
  Filled 2020-01-27: qty 2

## 2020-01-27 MED ORDER — ONDANSETRON HCL 4 MG/2ML IJ SOLN
INTRAMUSCULAR | Status: DC | PRN
  Administered 2020-01-27: 4 mg via INTRAVENOUS

## 2020-01-27 MED ORDER — ALUM & MAG HYDROXIDE-SIMETH 200-200-20 MG/5ML PO SUSP: 15.0000 mL | ORAL | Status: DC | PRN

## 2020-01-27 MED ORDER — PHENYLEPHRINE HCL (PRESSORS) 10 MG/ML IV SOLN
INTRAVENOUS | Status: DC | PRN
  Administered 2020-01-27: 120 ug via INTRAVENOUS

## 2020-01-27 MED ORDER — CEFAZOLIN SODIUM-DEXTROSE 2-4 GM/100ML-% IV SOLN
INTRAVENOUS | Status: AC
  Filled 2020-01-27: qty 100

## 2020-01-27 MED ORDER — MORPHINE SULFATE (PF) 2 MG/ML IV SOLN: 2.0000 mg | INTRAVENOUS | Status: DC | PRN

## 2020-01-27 MED ORDER — ASPIRIN EC 81 MG PO TBEC
81.0000 mg | DELAYED_RELEASE_TABLET | Freq: Every day | ORAL | Status: DC
  Administered 2020-01-28: 81 mg via ORAL
  Filled 2020-01-27: qty 1

## 2020-01-27 MED ORDER — MIDAZOLAM HCL 2 MG/2ML IJ SOLN
INTRAMUSCULAR | Status: AC
  Filled 2020-01-27: qty 2

## 2020-01-27 MED ORDER — SUGAMMADEX SODIUM 200 MG/2ML IV SOLN
INTRAVENOUS | Status: DC | PRN
  Administered 2020-01-27: 200 mg via INTRAVENOUS

## 2020-01-27 MED ORDER — POTASSIUM CHLORIDE CRYS ER 20 MEQ PO TBCR: 20.0000 meq | EXTENDED_RELEASE_TABLET | Freq: Every day | ORAL | Status: DC | PRN

## 2020-01-27 MED ORDER — CHLORHEXIDINE GLUCONATE CLOTH 2 % EX PADS
6.0000 | MEDICATED_PAD | Freq: Every day | CUTANEOUS | Status: DC
  Administered 2020-01-27 – 2020-01-28 (×2): 6 via TOPICAL

## 2020-01-27 MED ORDER — LABETALOL HCL 5 MG/ML IV SOLN: 10.0000 mg | INTRAVENOUS | Status: DC | PRN

## 2020-01-27 MED ORDER — LIDOCAINE HCL URETHRAL/MUCOSAL 2 % EX GEL
CUTANEOUS | Status: DC | PRN
  Administered 2020-01-27: 1

## 2020-01-27 MED ORDER — GUAIFENESIN-DM 100-10 MG/5ML PO SYRP: 15.0000 mL | ORAL_SOLUTION | ORAL | Status: DC | PRN

## 2020-01-27 MED ORDER — DOCUSATE SODIUM 100 MG PO CAPS
100.0000 mg | ORAL_CAPSULE | Freq: Every day | ORAL | Status: DC
  Administered 2020-01-28: 100 mg via ORAL
  Filled 2020-01-27: qty 1

## 2020-01-27 MED ORDER — DEXAMETHASONE SODIUM PHOSPHATE 10 MG/ML IJ SOLN
INTRAMUSCULAR | Status: DC | PRN
  Administered 2020-01-27: 4 mg via INTRAVENOUS

## 2020-01-27 MED ORDER — OXYCODONE-ACETAMINOPHEN 5-325 MG PO TABS: 1.0000 | ORAL_TABLET | ORAL | Status: DC | PRN

## 2020-01-27 MED ORDER — CHLORHEXIDINE GLUCONATE CLOTH 2 % EX PADS: 6.0000 | MEDICATED_PAD | Freq: Every day | CUTANEOUS | Status: DC

## 2020-01-27 MED ORDER — LIDOCAINE HCL URETHRAL/MUCOSAL 2 % EX GEL
CUTANEOUS | Status: AC
  Filled 2020-01-27: qty 11

## 2020-01-27 MED ORDER — CARVEDILOL 25 MG PO TABS
25.0000 mg | ORAL_TABLET | Freq: Two times a day (BID) | ORAL | Status: DC
  Administered 2020-01-27 – 2020-01-28 (×2): 25 mg via ORAL
  Filled 2020-01-27 (×3): qty 1

## 2020-01-27 MED ORDER — ROCURONIUM BROMIDE 10 MG/ML (PF) SYRINGE
PREFILLED_SYRINGE | INTRAVENOUS | Status: DC | PRN
  Administered 2020-01-27: 60 mg via INTRAVENOUS
  Administered 2020-01-27: 40 mg via INTRAVENOUS

## 2020-01-27 MED ORDER — ACETAMINOPHEN 325 MG PO TABS
325.0000 mg | ORAL_TABLET | ORAL | Status: DC | PRN
  Administered 2020-01-27 – 2020-01-28 (×2): 650 mg via ORAL
  Filled 2020-01-27 (×2): qty 2

## 2020-01-27 MED ORDER — MUPIROCIN 2 % EX OINT: 1.0000 "application " | TOPICAL_OINTMENT | Freq: Two times a day (BID) | CUTANEOUS | Status: DC

## 2020-01-27 MED ORDER — MUPIROCIN 2 % EX OINT
TOPICAL_OINTMENT | CUTANEOUS | Status: AC
  Administered 2020-01-27: 1 via NASAL
  Filled 2020-01-27: qty 22

## 2020-01-27 MED ORDER — HYDRALAZINE HCL 20 MG/ML IJ SOLN: 5.0000 mg | INTRAMUSCULAR | Status: DC | PRN

## 2020-01-27 MED ORDER — CHLORHEXIDINE GLUCONATE 0.12 % MT SOLN
15.0000 mL | Freq: Once | OROMUCOSAL | Status: AC
  Administered 2020-01-27: 15 mL via OROMUCOSAL
  Filled 2020-01-27: qty 15

## 2020-01-27 MED ORDER — PHENOL 1.4 % MT LIQD: 1.0000 | OROMUCOSAL | Status: DC | PRN

## 2020-01-27 MED ORDER — LIDOCAINE 2% (20 MG/ML) 5 ML SYRINGE
INTRAMUSCULAR | Status: DC | PRN
  Administered 2020-01-27: 60 mg via INTRAVENOUS

## 2020-01-27 MED ORDER — PROPOFOL 10 MG/ML IV BOLUS
INTRAVENOUS | Status: DC | PRN
  Administered 2020-01-27: 200 mg via INTRAVENOUS

## 2020-01-27 MED ORDER — CEFAZOLIN SODIUM-DEXTROSE 2-4 GM/100ML-% IV SOLN
2.0000 g | Freq: Three times a day (TID) | INTRAVENOUS | Status: AC
  Administered 2020-01-27 (×2): 2 g via INTRAVENOUS
  Filled 2020-01-27 (×2): qty 100

## 2020-01-27 MED ORDER — HEPARIN SODIUM (PORCINE) 1000 UNIT/ML IJ SOLN
INTRAMUSCULAR | Status: AC
  Filled 2020-01-27: qty 1

## 2020-01-27 MED ORDER — PRAVASTATIN SODIUM 40 MG PO TABS
40.0000 mg | ORAL_TABLET | Freq: Every day | ORAL | Status: DC
  Administered 2020-01-28: 40 mg via ORAL
  Filled 2020-01-27 (×2): qty 1

## 2020-01-27 MED ORDER — LACTATED RINGERS IV SOLN: INTRAVENOUS | Status: DC

## 2020-01-27 MED ORDER — FENTANYL CITRATE (PF) 100 MCG/2ML IJ SOLN
INTRAMUSCULAR | Status: DC | PRN
  Administered 2020-01-27: 100 ug via INTRAVENOUS
  Administered 2020-01-27: 50 ug via INTRAVENOUS

## 2020-01-27 MED ORDER — ACETAMINOPHEN 650 MG RE SUPP: 325.0000 mg | RECTAL | Status: DC | PRN

## 2020-01-27 MED ORDER — DEXAMETHASONE SODIUM PHOSPHATE 10 MG/ML IJ SOLN
INTRAMUSCULAR | Status: AC
  Filled 2020-01-27: qty 1

## 2020-01-27 MED ORDER — ORAL CARE MOUTH RINSE: 15.0000 mL | Freq: Once | OROMUCOSAL | Status: AC

## 2020-01-27 MED ORDER — CEFAZOLIN SODIUM-DEXTROSE 2-4 GM/100ML-% IV SOLN
2.0000 g | INTRAVENOUS | Status: AC
  Administered 2020-01-27: 2 g via INTRAVENOUS

## 2020-01-27 MED ORDER — METOPROLOL TARTRATE 5 MG/5ML IV SOLN: 2.0000 mg | INTRAVENOUS | Status: DC | PRN

## 2020-01-27 MED ORDER — PHENYLEPHRINE HCL-NACL 10-0.9 MG/250ML-% IV SOLN
INTRAVENOUS | Status: DC | PRN
  Administered 2020-01-27: 25 ug/min via INTRAVENOUS

## 2020-01-27 MED ORDER — SODIUM CHLORIDE 0.9 % IV SOLN
INTRAVENOUS | Status: DC | PRN
  Administered 2020-01-27 (×2): 500 mL

## 2020-01-27 SURGICAL SUPPLY — 76 items
BAG DECANTER FOR FLEXI CONT (MISCELLANEOUS) ×6 IMPLANT
CANISTER SUCT 3000ML PPV (MISCELLANEOUS) ×6 IMPLANT
CANNULA VESSEL 3MM 2 BLNT TIP (CANNULA) ×6 IMPLANT
CATH BEACON 5.038 65CM KMP-01 (CATHETERS) ×6 IMPLANT
CATH COUDE FOLEY 2W 5CC 16FR (CATHETERS) ×6 IMPLANT
CATH FOLEY 2W COUNCIL 5CC 16FR (CATHETERS) ×6 IMPLANT
CATH OMNI FLUSH .035X70CM (CATHETERS) ×6 IMPLANT
CLIP LIGATING EXTRA MED SLVR (CLIP) ×6 IMPLANT
CLIP LIGATING EXTRA SM BLUE (MISCELLANEOUS) ×6 IMPLANT
COVER PROBE W GEL 5X96 (DRAPES) ×6 IMPLANT
COVER WAND RF STERILE (DRAPES) IMPLANT
DERMABOND ADVANCED (GAUZE/BANDAGES/DRESSINGS) ×4
DERMABOND ADVANCED .7 DNX12 (GAUZE/BANDAGES/DRESSINGS) ×8 IMPLANT
DEVICE CLOSURE PERCLS PRGLD 6F (VASCULAR PRODUCTS) ×4 IMPLANT
DEVICE TORQUE KENDALL .025-038 (MISCELLANEOUS) ×6 IMPLANT
DRSG TEGADERM 2-3/8X2-3/4 SM (GAUZE/BANDAGES/DRESSINGS) ×6 IMPLANT
DRYSEAL FLEXSHEATH 12FR 33CM (SHEATH) ×2
DRYSEAL FLEXSHEATH 14FR 33CM (SHEATH) ×2
DRYSEAL FLEXSHEATH 16FR 33CM (SHEATH) ×2
ELECT REM PT RETURN 9FT ADLT (ELECTROSURGICAL) ×6
ELECTRODE REM PT RTRN 9FT ADLT (ELECTROSURGICAL) ×4 IMPLANT
EXCLDR TRNK ENDO 23X14.5X12 15 (Endovascular Graft) ×6 IMPLANT
EXCLUDER TNK END 23X14.5X12 15 (Endovascular Graft) ×4 IMPLANT
GLOVE SS BIOGEL STRL SZ 7.5 (GLOVE) ×4 IMPLANT
GLOVE SUPERSENSE BIOGEL SZ 7.5 (GLOVE) ×2
GOWN STRL REUS W/ TWL LRG LVL3 (GOWN DISPOSABLE) ×12 IMPLANT
GOWN STRL REUS W/TWL LRG LVL3 (GOWN DISPOSABLE) ×6
GRAFT BALLN CATH 65CM (STENTS) ×4 IMPLANT
GUIDEWIRE ANGLED .035X150CM (WIRE) ×6 IMPLANT
GUIDEWIRE STR DUAL SENSOR (WIRE) ×6 IMPLANT
IV NS IRRIG 3000ML ARTHROMATIC (IV SOLUTION) ×6 IMPLANT
KIT BASIN OR (CUSTOM PROCEDURE TRAY) ×6 IMPLANT
KIT TURNOVER KIT B (KITS) ×6 IMPLANT
LEG CONTRALATERAL 23X14 (Endovascular Graft) ×6 IMPLANT
LEG CONTRALETERAL16X16X11.5 (Endovascular Graft) ×2 IMPLANT
LOOP VESSEL MAXI BLUE (MISCELLANEOUS) ×6 IMPLANT
LOOP VESSEL MINI RED (MISCELLANEOUS) ×6 IMPLANT
NEEDLE PERC 18GX7CM (NEEDLE) ×6 IMPLANT
NS IRRIG 1000ML POUR BTL (IV SOLUTION) IMPLANT
PACK ENDOVASCULAR (PACKS) ×6 IMPLANT
PAD ARMBOARD 7.5X6 YLW CONV (MISCELLANEOUS) ×12 IMPLANT
PERCLOSE PROGLIDE 6F (VASCULAR PRODUCTS) ×6
SET CYSTO W/LG BORE CLAMP LF (SET/KITS/TRAYS/PACK) ×6 IMPLANT
SHEATH BRITE TIP 8FR 23CM (SHEATH) ×6 IMPLANT
SHEATH DRYSEAL FLEX 12FR 33CM (SHEATH) ×4 IMPLANT
SHEATH DRYSEAL FLEX 14FR 33CM (SHEATH) ×4 IMPLANT
SHEATH DRYSEAL FLEX 16FR 33CM (SHEATH) ×4 IMPLANT
SHEATH PINNACLE 8F 10CM (SHEATH) ×6 IMPLANT
STENT GRAFT BALLN CATH 65CM (STENTS) ×2
STENT GRAFT CONTRALAT 16X11.5 (Endovascular Graft) ×4 IMPLANT
STOPCOCK MORSE 400PSI 3WAY (MISCELLANEOUS) ×6 IMPLANT
SURGILUBE 2OZ TUBE FLIPTOP (MISCELLANEOUS) ×6 IMPLANT
SUT ETHILON 3 0 PS 1 (SUTURE) IMPLANT
SUT PROLENE 5 0 C 1 24 (SUTURE) ×6 IMPLANT
SUT PROLENE 6 0 CC (SUTURE) ×6 IMPLANT
SUT SILK 2 0 (SUTURE) ×2
SUT SILK 2-0 18XBRD TIE 12 (SUTURE) ×4 IMPLANT
SUT SILK 3 0 (SUTURE) ×2
SUT SILK 3-0 18XBRD TIE 12 (SUTURE) ×4 IMPLANT
SUT SILK 4 0 (SUTURE) ×2
SUT SILK 4-0 18XBRD TIE 12 (SUTURE) ×4 IMPLANT
SUT VIC AB 2-0 CT1 27 (SUTURE) ×2
SUT VIC AB 2-0 CT1 TAPERPNT 27 (SUTURE) ×4 IMPLANT
SUT VIC AB 2-0 CTX 36 (SUTURE) IMPLANT
SUT VIC AB 3-0 SH 18 (SUTURE) IMPLANT
SUT VIC AB 3-0 SH 27 (SUTURE) ×2
SUT VIC AB 3-0 SH 27X BRD (SUTURE) ×4 IMPLANT
SUT VICRYL 4-0 PS2 18IN ABS (SUTURE) ×12 IMPLANT
SYR 20ML LL LF (SYRINGE) ×6 IMPLANT
SYR TOOMEY IRRIG 70ML (MISCELLANEOUS) ×6
SYRINGE TOOMEY IRRIG 70ML (MISCELLANEOUS) ×4 IMPLANT
TOWEL GREEN STERILE (TOWEL DISPOSABLE) ×6 IMPLANT
TRAY FOLEY MTR SLVR 16FR STAT (SET/KITS/TRAYS/PACK) ×6 IMPLANT
TUBING HIGH PRESSURE 120CM (CONNECTOR) ×6 IMPLANT
WIRE AMPLATZ SS-J .035X180CM (WIRE) ×12 IMPLANT
WIRE BENTSON .035X145CM (WIRE) ×12 IMPLANT

## 2020-01-27 NOTE — Anesthesia Postprocedure Evaluation (Signed)
Anesthesia Post Note  Patient: George Walsh  Procedure(s) Performed: ABDOMINAL AORTIC ENDOVASCULAR STENT GRAFT (N/A Abdomen) ARTERY REPAIR-LEFT FEMORAL ARTERY (Left Groin) ULTRASOUND GUIDANCE FOR VASCULAR ACCESS (Bilateral Groin) CYSTOSCOPY with difficult foley catheter placement over a wire (N/A Penis)     Patient location during evaluation: PACU Anesthesia Type: General Level of consciousness: awake and alert Pain management: pain level controlled Vital Signs Assessment: post-procedure vital signs reviewed and stable Respiratory status: spontaneous breathing, nonlabored ventilation, respiratory function stable and patient connected to nasal cannula oxygen Cardiovascular status: blood pressure returned to baseline and stable Postop Assessment: no apparent nausea or vomiting Anesthetic complications: no   No complications documented.  Last Vitals:  Vitals:   01/27/20 1338 01/27/20 1408  BP: 128/77 134/75  Pulse: 62 67  Resp: 20 18  Temp:    SpO2: 98% 97%    Last Pain:  Vitals:   01/27/20 1338  TempSrc:   PainSc: 0-No pain                 Tiajuana Amass

## 2020-01-27 NOTE — Consult Note (Signed)
Urology Consult   Physician requesting consult: Curt Jews  Reason for consult: Difficult foley  History of Present Illness: George Walsh is a 79 y.o. with a AAA in OR for an abdominal aortic endovascular stent. Nursing staff and physician was unable to place a Foley catheter. They met resistance in the proximal urethra. He has no known urologic history. History is unable to be obtained as he was intubated and sedated in the operating room.  Past Medical History:  Diagnosis Date  . AAA (abdominal aortic aneurysm) ([place])   . Arthritis   . Cancer ([place])    skin cancer  . Cataract   . Color blind   . Coronary artery disease    11/05/07 [facility]): DES pLAD, severe DIAG stenosis not amenabale to PCI given small size, occluded RCA with left-to-right collaterals, unsuccessful attempt at PCI  . GERD (gastroesophageal reflux disease)   . Hyperlipidemia   . Hypertension   . Stroke [facility])     Past Surgical History:  Procedure Laterality Date  . CATARACT EXTRACTION, BILATERAL Bilateral 2017  . COLONOSCOPY W/ BIOPSIES    . EYE SURGERY Bilateral    cataracts  . OTHER SURGICAL HISTORY     stent placed unsure exactly where  . OTHER SURGICAL HISTORY     Stent - BMS single    Medications:  Home meds:  No current facility-administered medications on file prior to encounter.   Current Outpatient Medications on File Prior to Encounter  Medication Sig Dispense Refill  . aspirin 81 MG EC tablet Take 81 mg by mouth daily.     . carvedilol (COREG) 25 MG tablet Take 25 mg by mouth 2 (two) times daily with a meal.     . losartan (COZAAR) 100 MG tablet Take 100 mg by mouth daily.     . magnesium oxide (MAG-OX) 400 MG tablet Take 400 mg by mouth daily.     . meloxicam (MOBIC) 7.5 MG tablet Take 7.5 mg by mouth daily as needed for pain.    . pantoprazole (PROTONIX) 40 MG tablet Take 40 mg by mouth daily.     . pravastatin (PRAVACHOL) 40 MG tablet Take 40 mg by mouth daily.     . SF 5000 PLUS 1.1 %  CREA dental cream Place 1 application onto teeth in the morning.       Scheduled Meds: . Chlorhexidine Gluconate Cloth  6 each Topical Once   And  . Chlorhexidine Gluconate Cloth  6 each Topical Once  . Chlorhexidine Gluconate Cloth  6 each Topical Daily  . mupirocin ointment  1 application Nasal BID   Continuous Infusions: . sodium chloride    . lactated ringers     PRN Meds:.heparin irrigation 6000 unit, iodixanol, lidocaine  Allergies: No Known Allergies  History reviewed. No pertinent family history.  Social History:  reports that he has never smoked. He has never used smokeless tobacco. He reports that he does not drink alcohol and does not use drugs.  ROS: Unable to be obtained  Physical Exam:  Vital signs in last 24 hours: Temp:  [98.6 F (37 C)] 98.6 F (37 C) (11/12 0635) Pulse Rate:  [61] 61 (11/12 0635) Resp:  [17] 17 (11/12 0635) BP: (167)/(89) 167/89 (11/12 0635) SpO2:  [97 %] 97 % (11/12 0635) Weight:  [79.2 kg] 79.2 kg (11/12 0635) Constitutional: Intubated, sedated Cardiovascular: Intubated, sedated Respiratory: Intubated, sedated GI: soft, no masses Genitourinary: No CVAT. Normal male phallus, testes are descended bilaterally and non-tender and  without masses, scrotum is normal in appearance without lesions or masses, perineum is normal on inspection.  Laboratory Data:  No results for input(s): WBC, HGB, HCT, PLT in the last 72 hours.  No results for input(s): NA, K, CL, GLUCOSE, BUN, CALCIUM, CREATININE in the last 72 hours.  Invalid input(s): CO3   Results for orders placed or performed during the hospital encounter of 01/27/20 (from the past 24 hour(s))  ABO/Rh     Status: None   Collection Time: 01/27/20  7:00 AM  Result Value Ref Range   ABO/RH(D)      O POS Performed at [facility] [address], [place], [place] [address]    Recent Results (from the past 240 hour(s))  SARS CORONAVIRUS 2 (TAT 6-24 HRS) Nasopharyngeal  Nasopharyngeal Swab     Status: None   Collection Time: 01/23/20  1:10 PM   Specimen: Nasopharyngeal Swab  Result Value Ref Range Status   SARS Coronavirus 2 NEGATIVE NEGATIVE Final    Comment: (NOTE) SARS-CoV-2 target nucleic acids are NOT DETECTED.  The SARS-CoV-2 RNA is generally detectable in upper and lower respiratory specimens during the acute phase of infection. Negative results do not preclude SARS-CoV-2 infection, do not rule out co-infections with other pathogens, and should not be used as the sole basis for treatment or other patient management decisions. Negative results must be combined with clinical observations, patient history, and epidemiological information. The expected result is Negative.  Fact Sheet for Patients: SugarRoll.be  Fact Sheet for Healthcare Providers: https://www.woods-mathews.com/  This test is not yet approved or cleared by the [place] FDA and  has been authorized for detection and/or diagnosis of SARS-CoV-2 by FDA under an Emergency Use Authorization (EUA). This EUA will remain  in effect (meaning this test can be used) for the duration of the COVID-19 declaration under Se ction 564(b)(1) of the Act, 21 U.S.C. section 360bbb-3(b)(1), unless the authorization is terminated or revoked sooner.  Performed at [facility], [place] [address] [place], [place] [address]   Surgical pcr screen     Status: Abnormal   Collection Time: 01/23/20  2:53 PM   Specimen: Nasal Mucosa; Nasal Swab  Result Value Ref Range Status   MRSA, PCR NEGATIVE NEGATIVE Final   Staphylococcus aureus POSITIVE (A) NEGATIVE Final    Comment: (NOTE) The Xpert SA Assay (FDA approved for NASAL specimens in patients 83 years of age and older), is one component of a comprehensive surveillance program. It is not intended to diagnose infection nor to guide or monitor treatment. Performed at [facility], [place] [address], [place],  [address]     Renal Function: Recent Labs    01/23/20 1451  CREATININE 1.27*   Estimated Creatinine Clearance: 49.5 mL/min (A) (by C-G formula based on SCr of 1.27 mg/dL (H)).  Radiologic Imaging: HYBRID OR IMAGING ([facility] ONLY)  Result Date: 01/27/2020 There is no interpretation for this exam.  This order is for images obtained during a surgical procedure.  Please See "Surgeries" Tab for more information regarding the procedure.    I independently reviewed the above imaging studies.  Impression/Recommendation 1. Traumatic Foley catheter: False passages demonstrated on cystoscopy 01/27/2020. Successful bypass of the false passage and placement of 16 French council tip catheter over a sensor wire into the bladder with return of clear yellow urine. -Leave Foley catheter in place for 3 days. Void trial per primary in 3 days. -We will arrange for follow-up in the  office.  George Walsh 01/27/2020, 10:08 AM  [facility]  Pager: [phone]   CC: Curt Jews, MD

## 2020-01-27 NOTE — Anesthesia Procedure Notes (Signed)
Arterial Line Insertion Start/End11/02/2020 7:12 AM, 01/27/2020 7:18 AM Performed by: Inda Coke, CRNA, CRNA  Preanesthetic checklist: patient identified, IV checked, site marked, risks and benefits discussed, surgical consent, monitors and equipment checked, pre-op evaluation, timeout performed and anesthesia consent Lidocaine 1% used for infiltration Right, radial was placed Catheter size: 20 G Hand hygiene performed  and maximum sterile barriers used  Allen's test indicative of satisfactory collateral circulation Attempts: 1 Procedure performed without using ultrasound guided technique. Following insertion, dressing applied and Biopatch. Post procedure assessment: normal  Patient tolerated the procedure well with no immediate complications.

## 2020-01-27 NOTE — Op Note (Signed)
OPERATIVE REPORT  DATE OF SURGERY: 01/27/2020  PATIENT: George Walsh, 79 y.o. male MRN: [id number]  DOB: 03/31/1940  PRE-OPERATIVE DIAGNOSIS: Abdominal aortic aneurysm  POST-OPERATIVE DIAGNOSIS:  Same  PROCEDURE: Gore stent graft repair abdominal aortic aneurysm  SURGEON:  Curt Jews, M.D.  PHYSICIAN ASSISTANT: Arlee Muslim, PA-C  The assistant was needed for exposure and to expedite the case  ANESTHESIA: General  EBL: per anesthesia record  Total I/O In: 1200 [I.V.:1100; IV Piggyback:100] Out: 450 [Urine:300; Blood:150]  BLOOD ADMINISTERED: none  DRAINS: none  SPECIMEN: none  COUNTS CORRECT:  YES  PATIENT DISPOSITION:  PACU - hemodynamically stable  PROCEDURE DETAILS: The patient was taken up and placed supine position where general anesthesia was initiated.  The Foley catheter had difficulty placing and urology was consulted.  [name] saw the patient intraoperatively and placed a Foley catheter with cystoscopy assistance.  The abdomen both groins were then prepped and draped you sterile fashion.  The right and left common femoral arteries were accessed with an 18-gauge needle using SonoSite bilaterally.  2 separate Perclose was were placed at the 2:00 and 10 o'clock position in each groin.  French sheaths were placed over the guidewire.  The Kumpe catheter was used to place the guidewire in the suprarenal aorta and Amplatz superstiff wires were placed bilaterally.  The patient was given 7000 units of intravenous heparin.  After adequate circulation time the 16 French sheath was passed over the left Amplatz wire to the level of the aorta.  On the right a 73 French sheath was placed.  The main body was brought onto the field this was a 43 x 14 x 12 main body.  It was placed in the reverse limb position.  A marker pigtail catheter was positioned to the right groin at the level of the renal arteries.  25 degree cranial projection was obtained and this showed the level of  the right and left renal artery takeoff.  The stent graft was deployed proximally just below the level of the left renal artery takeoff.  This was confirmed with additional arteriogram.  Next the anterolateral limb was accessed.  This was done by placing a Bentson wire and a Kumpe catheter through the right sheath.  The contralateral limb was cannulated and this was confirmed by exchanging the Kumpe catheter for a pigtail catheter.  This was twisted within the stent graft to confirm that this was intra graft.  Retrograde injection through the right femoral sheath showed the area of the right internal iliac artery takeoff.  There was some ectasia of the right common iliac artery.  A 14 x 23 limb was chosen and this was placed land just above the hypogastric artery takeoff on the right.  Finally the ipsilateral limb was deployed and a retrograde injection through the left groin sheath revealed the level of the hypogastric takeoff.  A 12 x 16 limb was chosen and again was placed to land just above the hypogastric artery takeoff.  The proximal and distal attachments and all junctions were dilated with an MO B balloon.  A final arteriogram was undertaken and this showed excellent positioning with no evidence of endoleak.  The right groin sheath was removed and the Perclose devices were secured giving good hemostasis.  On the left it appeared that though the Perclose had partially fired into the subcutaneous tissue.  There was poor hemostasis and therefore pressure was held initially and an incision was made over the left femoral artery.  The  artery was exposed and occluded proximal and distal to the sheath entry site.  The hole in the artery was controlled with a running 5-0 Prolene suture.  Clamps were removed and good hemostasis of the counter.  The left groin was closed with 3-0 Vicryl in the subcutaneous tissue and the skin was closed with 4 subcuticular Vicryl stitch.  Sterile dressing was applied.  The patient had  palpable pedal pulses bilaterally and was transferred to the recovery room in stable condition   Rosetta Posner, M.D., [facility] 01/27/2020 1:15 PM

## 2020-01-27 NOTE — Transfer of Care (Signed)
Immediate Anesthesia Transfer of Care Note  Patient: Niccolo Makela  Procedure(s) Performed: ABDOMINAL AORTIC ENDOVASCULAR STENT GRAFT (N/A Abdomen) ARTERY REPAIR-LEFT FEMORAL ARTERY (Left Groin) ULTRASOUND GUIDANCE FOR VASCULAR ACCESS (Bilateral Groin) CYSTOSCOPY with difficult foley catheter placement over a wire (N/A Penis)  Patient Location: PACU  Anesthesia Type:General  Level of Consciousness: awake and drowsy  Airway & Oxygen Therapy: Patient Spontanous Breathing and Patient connected to nasal cannula oxygen  Post-op Assessment: Report given to RN and Post -op Vital signs reviewed and stable  Post vital signs: Reviewed and stable  Last Vitals:  Vitals Value Taken Time  BP 106/62 01/27/20 1108  Temp    Pulse 61 01/27/20 1110  Resp 15 01/27/20 1110  SpO2 96 % 01/27/20 1110  Vitals shown include unvalidated device data.  Last Pain:  Vitals:   01/27/20 0635  TempSrc: Oral  PainSc:       Patients Stated Pain Goal: 4 (23/30/07 6226)  Complications: No complications documented.

## 2020-01-27 NOTE — Interval H&P Note (Signed)
History and Physical Interval Note:  01/27/2020 7:12 AM  George Walsh  has presented today for surgery, with the diagnosis of AAA.  The various methods of treatment have been discussed with the patient and family. After consideration of risks, benefits and other options for treatment, the patient has consented to  Procedure(s): ABDOMINAL AORTIC ENDOVASCULAR STENT GRAFT (N/A) as a surgical intervention.  The patient's history has been reviewed, patient examined, no change in status, stable for surgery.  I have reviewed the patient's chart and labs.  Questions were answered to the patient's satisfaction.     Curt Jews

## 2020-01-27 NOTE — Anesthesia Procedure Notes (Signed)
Procedure Name: Intubation Date/Time: 01/27/2020 7:39 AM Performed by: Inda Coke, CRNA Pre-anesthesia Checklist: Patient identified, Emergency Drugs available, Suction available and Patient being monitored Patient Re-evaluated:Patient Re-evaluated prior to induction Oxygen Delivery Method: Circle System Utilized Preoxygenation: Pre-oxygenation with 100% oxygen Induction Type: IV induction Ventilation: Mask ventilation without difficulty and Oral airway inserted - appropriate to patient size Laryngoscope Size: Mac and 4 Grade View: Grade II Tube type: Oral Tube size: 7.5 mm Number of attempts: 1 Airway Equipment and Method: Stylet and Oral airway Placement Confirmation: ETT inserted through vocal cords under direct vision,  positive ETCO2 and breath sounds checked- equal and bilateral Secured at: 22 cm Tube secured with: Tape Dental Injury: Teeth and Oropharynx as per pre-operative assessment

## 2020-01-27 NOTE — Progress Notes (Signed)
Attempted to place foley, 80F non latex. Unsuccessful attempt. Blood noted in foley. MD paged to room. MD made 2nd attempt which was also unsuccessful. MD used Lidocaine jelly (see Intra-op meds) to assist in attempt. Urology paged. Successful placement using cystoscope by Urologist,  [name] (see [name] note for additional info).

## 2020-01-27 NOTE — Op Note (Signed)
Operative Note  Preoperative diagnosis:  1. Traumatic Foley catheter placement  Postoperative diagnosis: 1. Traumatic Foley catheter placement with false passage into the bulbar urethra.  Procedure(s): 1. Cystoscopy 2. Difficult Foley catheter placement over a wire  Surgeon: Rexene Alberts, MD  Assistants:  None  Anesthesia:  General  Complications:  None  EBL: Minimal for my portion  Specimens: 1. None for my portion  Drains/Catheters: 1. 16 French council tip catheter  Intraoperative findings:   1. False passage into the bulbar urethra with minimal amount of clot and debris. Narrowed none none dorsally which I was able to easily pass a 87 French cystoscope into the bladder. Surveillance of bladder revealed no suspicious lesions, bladder stones or other abnormality. Successful placement of 16 [place] council tip catheter over a wire.  Indication:  George Walsh is a 79 y.o. male with AAA here for endovascular repair. Intraoperatively, was found in a Foley catheter was not able to be placed. The nursing staff and physician met resistance in the proximal urethra. Urology was paged to assist with placement of Foley catheter.  Description of procedure: The patient was already under general anesthesia and in the supine position. A preoperative Time Out was performed addressing the anticipated surgical site, procedure, and safety precautions.  The 17 French flexible cystoscope was inserted into the patient's urethra meatus and advanced. Identified a false passage in the bulbar urethra. I was able to navigate dorsally where I identified the lumen. I have passed this into the mildly obstructing prostate and into the bladder. The bladder was inspected. The ureteral orifices were in their normal anatomic positions. The bladder showed no trabeculation. There is no bladder tumors noted. I then passed a 0.038 sensor wire into the bladder and removed the flexible cystoscope. Over the wire, I then  placed a 16 [place] council tip Foley catheter. I instilled 10 mL of water into the balloon. We had return of clear yellow urine. The case was then turned back over to the vascular team.  Plan: Leave Foley catheter in place for 3 days. Void trial in 3 days per primary. Will arrange for follow-up in the office.  [name]  Pager: [phone]

## 2020-01-28 LAB — BASIC METABOLIC PANEL
Anion gap: 8 (ref 5–15)
BUN: 24 mg/dL — ABNORMAL HIGH (ref 8–23)
CO2: 22 mmol/L (ref 22–32)
Calcium: 8.6 mg/dL — ABNORMAL LOW (ref 8.9–10.3)
Chloride: 106 mmol/L (ref 98–111)
Creatinine, Ser: 1.05 mg/dL (ref 0.61–1.24)
GFR, Estimated: >60 mL/min (ref >60)
Glucose, Bld: 119 mg/dL — ABNORMAL HIGH (ref 70–99)
Potassium: 3.6 mmol/L (ref 3.5–5.1)
Sodium: 136 mmol/L (ref 135–145)

## 2020-01-28 LAB — CBC
HCT: 34.6 % — ABNORMAL LOW (ref 39.0–52.0)
Hemoglobin: 11.8 g/dL — ABNORMAL LOW (ref 13.0–17.0)
MCH: 31 pg (ref 26.0–34.0)
MCHC: 34.1 g/dL (ref 30.0–36.0)
MCV: 90.8 fL (ref 80.0–100.0)
Platelets: 226 10*3/uL (ref 150–400)
RBC: 3.81 MIL/uL — ABNORMAL LOW (ref 4.22–5.81)
RDW: 13.4 % (ref 11.5–15.5)
WBC: 12.2 10*3/uL — ABNORMAL HIGH (ref 4.0–10.5)
nRBC: 0 % (ref 0.0–0.2)

## 2020-01-28 MED ORDER — OXYCODONE-ACETAMINOPHEN 5-325 MG PO TABS: 1.0000 | ORAL_TABLET | Freq: Four times a day (QID) | ORAL | 0 refills | Status: DC | PRN

## 2020-01-28 NOTE — Discharge Instructions (Signed)
   [facility]   Discharge Instructions  Endovascular Aortic Aneurysm Repair  Please refer to the following instructions for your post-procedure care. Your surgeon or Physician Assistant will discuss any changes with you.  Activity  You are encouraged to walk as much as you can. You can slowly return to normal activities but must avoid strenuous activity and heavy lifting until your doctor tells you it's OK. Avoid activities such as vacuuming or swinging a gold club. It is normal to feel tired for several weeks after your surgery. Do not drive until your doctor gives the OK and you are no longer taking prescription pain medications. It is also normal to have difficulty with sleep habits, eating, and bowel movements after surgery. These will go away with time.  Bathing/Showering  You may shower after you go home. If you have an incision, do not soak in a bathtub, hot tub, or swim until the incision heals completely.  Incision Care  Shower every day. Clean your incision with mild soap and water. Pat the area dry with a clean towel. You do not need a bandage unless otherwise instructed. Do not apply any ointments or creams to your incision. If you clothing is irritating, you may cover your incision with a dry gauze pad.  Diet  Resume your normal diet. There are no special food restrictions following this procedure. A low fat/low cholesterol diet is recommended for all patients with vascular disease. In order to heal from your surgery, it is CRITICAL to get adequate nutrition. Your body requires vitamins, minerals, and protein. Vegetables are the best source of vitamins and minerals. Vegetables also provide the perfect balance of protein. Processed food has little nutritional value, so try to avoid this.  Medications  Resume taking all of your medications unless your doctor or nurse practitioner tells you not to. If your incision is causing pain, you may take  over-the-counter pain relievers such as acetaminophen (Tylenol). If you were prescribed a stronger pain medication, please be aware these medications can cause nausea and constipation. Prevent nausea by taking the medication with a snack or meal. Avoid constipation by drinking plenty of fluids and eating foods with a high amount of fiber, such as fruits, vegetables, and grains. Do not take Tylenol if you are taking prescription pain medications.   Follow up  Our office will schedule a follow-up appointment with a C.T. scan 3-4 weeks after your surgery.  Please call us immediately for any of the following conditions  Severe or worsening pain in your legs or feet or in your abdomen back or chest. Increased pain, redness, drainage (pus) from your incision sit. Increased abdominal pain, bloating, nausea, vomiting or persistent diarrhea. Fever of 101 degrees or higher. Swelling in your leg (s),  Reduce your risk of vascular disease  Stop smoking. If you would like help call [facility] at 1-800-QUIT-NOW (1-800-784-8669) or [facility] at 336-586-4000. Manage your cholesterol Maintain a desired weight Control your diabetes Keep your blood pressure down  If you have questions, please call the office at [phone].   

## 2020-01-28 NOTE — Progress Notes (Addendum)
  Progress Note    01/28/2020 8:20 AM 1 Day Post-Op  Subjective:  Some soreness with movement L groin   Vitals:   01/27/20 2315 01/28/20 0415  BP: 123/65 117/73  Pulse: 65   Resp: 16 16  Temp: 98.2 F (36.8 C) 98.2 F (36.8 C)  SpO2: 95% 95%   Physical Exam: Cardiac:  RRR Lungs:  Non labored Incisions:  R groin without hematoma; L groin incision c/d/i Extremities:  Feet warm and well perfused Abdomen:  Soft, NT, ND Neurologic: A&O  CBC    Component Value Date/Time   WBC 12.2 (H) 01/28/2020 0411   RBC 3.81 (L) 01/28/2020 0411   HGB 11.8 (L) 01/28/2020 0411   HCT 34.6 (L) 01/28/2020 0411   PLT 226 01/28/2020 0411   MCV 90.8 01/28/2020 0411   MCH 31.0 01/28/2020 0411   MCHC 34.1 01/28/2020 0411   RDW 13.4 01/28/2020 0411    BMET    Component Value Date/Time   NA 136 01/28/2020 0411   K 3.6 01/28/2020 0411   CL 106 01/28/2020 0411   CO2 22 01/28/2020 0411   GLUCOSE 119 (H) 01/28/2020 0411   BUN 24 (H) 01/28/2020 0411   CREATININE 1.05 01/28/2020 0411   CALCIUM 8.6 (L) 01/28/2020 0411   GFRNONAA >60 01/28/2020 0411    INR    Component Value Date/Time   INR 1.2 01/27/2020 1551     Intake/Output Summary (Last 24 hours) at 01/28/2020 0820 Last data filed at 01/28/2020 0600 Gross per 24 hour  Intake 2443.96 ml  Output 1800 ml  Net 643.96 ml     Assessment/Plan:  79 y.o. male is s/p EVAR with L CFA cutdown 1 Day Post-Op   BLE well perfused abd exam benign Ok for discharge home today Per Urology, patient will follow up in office for void trial on Monday Follow up with Dr. Donnetta Hutching in 4 weeks with CTA abd/pelvis    Dagoberto Ligas, PA-C [facility] [phone] 01/28/2020 8:20 AM  Agree with above. D/c home  Ruta Hinds, MD [facility] [place] Office: [phone]

## 2020-01-29 NOTE — Discharge Summary (Signed)
EVAR Discharge Summary   George Walsh 1940-08-14 79 y.o. male  MRN: [id number]  Admission Date: 01/27/2020  Discharge Date: 01/28/20  Physician: Dr. Donnetta Hutching  Admission Diagnosis: AAA (abdominal aortic aneurysm) [facility]) [I71.4]  Discharge Day services:    see progress note 01/28/20  Hospital Course:  The patient was admitted to the hospital and taken to the operating room on 01/27/2020 and underwent: Endovascular repair of abdominal aortic aneurysm by Dr. Donnetta Hutching on 01/27/2020.  It should be noted that a urology consult was requested for cystoscopy and difficult Foley placement.  Also due to Pro-glide closure failure of left groin he also underwent left common femoral artery cutdown and repair.  Patient tolerated the surgery well and was admitted postoperatively.  POD #1 abdominal exam was benign and bilateral lower extremities were well perfused.  He will follow-up with urology on Monday 11/15 for void trial.  He will follow-up with Dr. Donnetta Hutching in the [place] office in 4 weeks with a CTA abdomen and pelvis.  He was discharged with 2 to 3 days of narcotic pain medication for continued postoperative pain control.  He was discharged in stable condition.   CBC    Component Value Date/Time   WBC 12.2 (H) 01/28/2020 0411   RBC 3.81 (L) 01/28/2020 0411   HGB 11.8 (L) 01/28/2020 0411   HCT 34.6 (L) 01/28/2020 0411   PLT 226 01/28/2020 0411   MCV 90.8 01/28/2020 0411   MCH 31.0 01/28/2020 0411   MCHC 34.1 01/28/2020 0411   RDW 13.4 01/28/2020 0411    BMET    Component Value Date/Time   NA 136 01/28/2020 0411   K 3.6 01/28/2020 0411   CL 106 01/28/2020 0411   CO2 22 01/28/2020 0411   GLUCOSE 119 (H) 01/28/2020 0411   BUN 24 (H) 01/28/2020 0411   CREATININE 1.05 01/28/2020 0411   CALCIUM 8.6 (L) 01/28/2020 0411   GFRNONAA >60 01/28/2020 0411         Discharge Diagnosis:  AAA (abdominal aortic aneurysm) ([place]) [I71.4]  Secondary Diagnosis: Patient Active Problem List    Diagnosis Date Noted  . AAA (abdominal aortic aneurysm) ([place]) 01/27/2020   Past Medical History:  Diagnosis Date  . AAA (abdominal aortic aneurysm) ([place])   . Arthritis   . Cancer ([place])    skin cancer  . Cataract   . Color blind   . Coronary artery disease    11/05/07 [facility]): DES pLAD, severe DIAG stenosis not amenabale to PCI given small size, occluded RCA with left-to-right collaterals, unsuccessful attempt at PCI  . GERD (gastroesophageal reflux disease)   . Hyperlipidemia   . Hypertension   . Stroke [facility])      Allergies as of 01/28/2020   No Known Allergies     Medication List    TAKE these medications   aspirin 81 MG EC tablet Take 81 mg by mouth daily.   carvedilol 25 MG tablet Commonly known as: COREG Take 25 mg by mouth 2 (two) times daily with a meal.   losartan 100 MG tablet Commonly known as: COZAAR Take 100 mg by mouth daily.   magnesium oxide 400 MG tablet Commonly known as: MAG-OX Take 400 mg by mouth daily.   meloxicam 7.5 MG tablet Commonly known as: MOBIC Take 7.5 mg by mouth daily as needed for pain.   oxyCODONE-acetaminophen 5-325 MG tablet Commonly known as: PERCOCET/ROXICET Take 1 tablet by mouth every 6 (six) hours as needed for moderate pain.   pantoprazole 40 MG tablet  Commonly known as: PROTONIX Take 40 mg by mouth daily.   pravastatin 40 MG tablet Commonly known as: PRAVACHOL Take 40 mg by mouth daily.   SF 5000 Plus 1.1 % Crea dental cream Generic drug: sodium fluoride Place 1 application onto teeth in the morning.       Discharge Instructions:   [facility]  Discharge Instructions Endovascular Aortic Aneurysm Repair  Please refer to the following instructions for your post-procedure care. Your surgeon or Physician Assistant will discuss any changes with you.  Activity  You are encouraged to walk as much as you can. You can slowly return to normal activities but must avoid  strenuous activity and heavy lifting until your doctor tells you it's OK. Avoid activities such as vacuuming or swinging a gold club. It is normal to feel tired for several weeks after your surgery. Do not drive until your doctor gives the OK and you are no longer taking prescription pain medications. It is also normal to have difficulty with sleep habits, eating, and bowel movements after surgery. These will go away with time.  Bathing/Showering  You may shower after you go home. If you have an incision, do not soak in a bathtub, hot tub, or swim until the incision heals completely.  Incision Care  Shower every day. Clean your incision with mild soap and water. Pat the area dry with a clean towel. You do not need a bandage unless otherwise instructed. Do not apply any ointments or creams to your incision. If you clothing is irritating, you may cover your incision with a dry gauze pad.  Diet  Resume your normal diet. There are no special food restrictions following this procedure. A low fat/low cholesterol diet is recommended for all patients with vascular disease. In order to heal from your surgery, it is CRITICAL to get adequate nutrition. Your body requires vitamins, minerals, and protein. Vegetables are the best source of vitamins and minerals. Vegetables also provide the perfect balance of protein. Processed food has little nutritional value, so try to avoid this.  Medications  Resume taking all of your medications unless your doctor or Physician Assistnat tells you not to. If your incision is causing pain, you may take over-the-counter pain relievers such as acetaminophen (Tylenol). If you were prescribed a stronger pain medication, please be aware these medications can cause nausea and constipation. Prevent nausea by taking the medication with a snack or meal. Avoid constipation by drinking plenty of fluids and eating foods with a high amount of fiber, such as fruits, vegetables, and grains.  Do not take Tylenol if you are taking prescription pain medications.   Follow up  [place] office will schedule a follow-up appointment with a C.T. scan 3-4 weeks after your surgery.  Please call us immediately for any of the following conditions  . Severe or worsening pain in your legs or feet or in your abdomen back or chest. . Increased pain, redness, drainage (pus) from your incision sit. . Increased abdominal pain, bloating, nausea, vomiting or persistent diarrhea. . Fever of 101 degrees or higher. . Swelling in your leg (s), .  Reduce your risk of vascular disease  .Stop smoking. If you would like help call [facility] at 1-800-QUIT-NOW 5158219545) or [place] at (787)038-8179. .Manage your cholesterol .Maintain a desired weight .Control your diabetes .Keep your blood pressure down  If you have questions, please call the office at [phone].   Disposition: home  Patient's condition: is Good  Follow  up: 1. Dr. Donnetta Hutching in 4 weeks with CTA protocol   Dagoberto Ligas, PA-C [facility] [phone] 01/29/2020  9:08 AM   - For VQI Registry use - Post-op:  Time to Extubation: [x]  In OR, [ ]  < 12 hrs, [ ]  12-24 hrs, [ ]  >=24 hrs Vasopressors Req. Post-op: No MI: No., [ ]  Troponin only, [ ]  EKG or Clinical New Arrhythmia: No CHF: No ICU Stay: 0 days Transfusion: No     Complications: Resp failure: No., [ ]  Pneumonia, [ ]  Ventilator Chg in renal function: No., [ ]  Inc. Cr > 0.5, [ ]  Temp. Dialysis,  [ ]  Permanent dialysis Leg ischemia: No., no Surgery needed, [ ]  Yes, Surgery needed,  [ ]  Amputation Bowel ischemia: No., [ ]  Medical Rx, [ ]  Surgical Rx Wound complication: No., [ ]  Superficial separation/infection, [ ]  Return to OR Return to OR: No  Return to OR for bleeding: No Stroke: No., [ ]  Minor, [ ]  Major  Discharge medications: Statin use:  Yes  ASA use:  Yes  Plavix use:  No  Beta blocker use:  Yes  ARB use:  Yes ACEI use:   No CCB use:  No

## 2020-01-30 ENCOUNTER — Encounter (HOSPITAL_COMMUNITY): Payer: Self-pay | Admitting: Vascular Surgery

## 2020-01-30 ENCOUNTER — Ambulatory Visit: Payer: Medicare Other | Admitting: Vascular Surgery

## 2020-02-01 ENCOUNTER — Other Ambulatory Visit: Payer: Self-pay | Admitting: *Deleted

## 2020-02-01 DIAGNOSIS — I714 Abdominal aortic aneurysm, without rupture, unspecified: Secondary | ICD-10-CM

## 2020-02-22 ENCOUNTER — Ambulatory Visit (HOSPITAL_COMMUNITY)
Admission: RE | Admit: 2020-02-22 | Discharge: 2020-02-22 | Disposition: A | Discharge status: Home / Self Care | Payer: Medicare Other | Source: Ambulatory Visit | Attending: Vascular Surgery | Admitting: Vascular Surgery

## 2020-02-22 ENCOUNTER — Other Ambulatory Visit: Payer: Self-pay

## 2020-02-22 DIAGNOSIS — I714 Abdominal aortic aneurysm, without rupture, unspecified: Secondary | ICD-10-CM

## 2020-02-22 MED ORDER — IOHEXOL 350 MG/ML SOLN
100.0000 mL | Freq: Once | INTRAVENOUS | Status: AC | PRN
  Administered 2020-02-22: 100 mL via INTRAVENOUS

## 2020-02-27 ENCOUNTER — Other Ambulatory Visit: Payer: Self-pay

## 2020-02-27 ENCOUNTER — Encounter: Payer: Self-pay | Admitting: Vascular Surgery

## 2020-02-27 ENCOUNTER — Ambulatory Visit (INDEPENDENT_AMBULATORY_CARE_PROVIDER_SITE_OTHER): Payer: Medicare Other | Admitting: Vascular Surgery

## 2020-02-27 VITALS — BP 131/77 | HR 60 | Temp 98.7°F | Resp 14 | Ht 69.0 in | Wt 176.0 lb

## 2020-02-27 DIAGNOSIS — Z95828 Presence of other vascular implants and grafts: Secondary | ICD-10-CM

## 2020-02-27 NOTE — Progress Notes (Signed)
   [facility] [place]  Patient name: George Walsh MRN: [id number] DOB: 09/22/1940 Sex: male  REASON FOR VISIT: Follow-up stent graft repair abdominal aortic aneurysm on 01/27/2020  HPI: George Walsh is a 79 y.o. male here today for follow-up.  Underwent stent graft repair on 01/27/2020.  He did have difficulty with Foley catheter placement and had [name] see him intraoperatively for Foley placement.  This has resolved after Foley removal.  Also had cutdown in his left groin due to some difficulty with hemostasis with the Perclose in his left groin.  He reports no discomfort and is walking without any difficulty.  Current Outpatient Medications  Medication Sig Dispense Refill  . aspirin 81 MG EC tablet Take 81 mg by mouth daily.     . carvedilol (COREG) 25 MG tablet Take 25 mg by mouth 2 (two) times daily with a meal.     . losartan (COZAAR) 100 MG tablet Take 100 mg by mouth daily.     . magnesium oxide (MAG-OX) 400 MG tablet Take 400 mg by mouth daily.     . meloxicam (MOBIC) 7.5 MG tablet Take 7.5 mg by mouth daily as needed for pain.    Marland Kitchen oxyCODONE-acetaminophen (PERCOCET/ROXICET) 5-325 MG tablet Take 1 tablet by mouth every 6 (six) hours as needed for moderate pain. 15 tablet 0  . pantoprazole (PROTONIX) 40 MG tablet Take 40 mg by mouth daily.     . pravastatin (PRAVACHOL) 40 MG tablet Take 40 mg by mouth daily.     . SF 5000 PLUS 1.1 % CREA dental cream Place 1 application onto teeth in the morning.     No current facility-administered medications for this visit.     PHYSICAL EXAM: Vitals:   02/27/20 1358  BP: 131/77  Pulse: 60  Resp: 14  Temp: 98.7 F (37.1 C)  TempSrc: Other (Comment)  SpO2: 97%  Weight: 176 lb (79.8 kg)  Height: 5\' 9"  (1.753 m)    GENERAL: The patient is a well-nourished male, in no acute distress. The vital signs are documented above. Left groin incision is completely healed and no evidence  of difficulty with his right groin puncture.  He has 2+ dorsalis pedis pulses bilaterally.  Abdomen soft with no tenderness  CT scan was reviewed with the patient and his wife present.  This shows excellent positioning of his stent graft with no evidence of endoleak.  He does have some ectasia of his iliac arteries bilaterally and has flared iliac limbs landing just above the iliac bifurcation bilaterally  MEDICAL ISSUES: Stable 1 month status post stent graft repair abdominal aneurysm.  We will continue full activities without limitation.  We will see him again in 1 year with ultrasound of his aorta at that time   Rosetta Posner, MD [facility] Tel [phone]

## 2021-06-04 ENCOUNTER — Other Ambulatory Visit: Payer: Self-pay

## 2021-06-04 NOTE — Progress Notes (Signed)
Opened in error

## 2021-06-05 ENCOUNTER — Ambulatory Visit (INDEPENDENT_AMBULATORY_CARE_PROVIDER_SITE_OTHER): Payer: Medicare PPO | Admitting: Vascular Surgery

## 2021-06-05 ENCOUNTER — Other Ambulatory Visit: Payer: Self-pay

## 2021-06-05 ENCOUNTER — Encounter: Payer: Self-pay | Admitting: Vascular Surgery

## 2021-06-05 ENCOUNTER — Ambulatory Visit (INDEPENDENT_AMBULATORY_CARE_PROVIDER_SITE_OTHER): Payer: Medicare PPO

## 2021-06-05 ENCOUNTER — Other Ambulatory Visit (HOSPITAL_COMMUNITY): Payer: Self-pay | Admitting: Vascular Surgery

## 2021-06-05 VITALS — BP 155/85 | HR 58 | Temp 97.2°F | Resp 14 | Ht 69.0 in | Wt 175.4 lb

## 2021-06-05 DIAGNOSIS — Z9889 Other specified postprocedural states: Secondary | ICD-10-CM | POA: Diagnosis not present

## 2021-06-05 DIAGNOSIS — Z95828 Presence of other vascular implants and grafts: Secondary | ICD-10-CM | POA: Diagnosis not present

## 2021-06-05 DIAGNOSIS — Z8679 Personal history of other diseases of the circulatory system: Secondary | ICD-10-CM | POA: Diagnosis not present

## 2021-06-05 NOTE — Progress Notes (Signed)
? ? [facility] [place] ? ?Patient name: George Walsh MRN: [id number] DOB: 20-May-1940 Sex: male ? ?REASON FOR VISIT: Follow-up endovascular repair of abdominal aortic aneurysm ? ?HPI: ?Drystan Reader is a 81 y.o. male here today for follow-up.  He is here today with his wife.  He remains in good health with no new major medical difficulties.  He has no difficulty associated with his aortic repair. ? ?Past Medical History:  ?Diagnosis Date  ? AAA (abdominal aortic aneurysm)   ? Arthritis   ? Cancer [facility])   ? skin cancer  ? Cataract   ? Color blind   ? Coronary artery disease   ? 11/05/07 ([place]): DES pLAD, severe DIAG stenosis not amenabale to PCI given small size, occluded RCA with left-to-right collaterals, unsuccessful attempt at PCI  ? GERD (gastroesophageal reflux disease)   ? Hyperlipidemia   ? Hypertension   ? Stroke [facility])   ? ? ?History reviewed. No pertinent family history. ? ?SOCIAL HISTORY: ?Social History  ? ?Tobacco Use  ? Smoking status: Never  ? Smokeless tobacco: Never  ?Substance Use Topics  ? Alcohol use: Never  ? ? ?No Known Allergies ? ?Current Outpatient Medications  ?Medication Sig Dispense Refill  ? aspirin 81 MG EC tablet Take 81 mg by mouth daily.     ? carvedilol (COREG) 25 MG tablet Take 25 mg by mouth 2 (two) times daily with a meal.     ? losartan (COZAAR) 100 MG tablet Take 100 mg by mouth daily.     ? magnesium oxide (MAG-OX) 400 MG tablet Take 400 mg by mouth daily.     ? meloxicam (MOBIC) 7.5 MG tablet Take 7.5 mg by mouth daily as needed for pain.    ? oxyCODONE-acetaminophen (PERCOCET/ROXICET) 5-325 MG tablet Take 1 tablet by mouth every 6 (six) hours as needed for moderate pain. 15 tablet 0  ? pantoprazole (PROTONIX) 40 MG tablet Take 40 mg by mouth daily.     ? pravastatin (PRAVACHOL) 40 MG tablet Take 40 mg by mouth daily.     ? SF 5000 PLUS 1.1 % CREA dental cream Place 1 application onto teeth in the morning.    ? ?No  current facility-administered medications for this visit.  ? ? ?REVIEW OF SYSTEMS:  ?'[X]'$  denotes positive finding, '[ ]'$  denotes negative finding ?Cardiac  Comments:  ?Chest pain or chest pressure:    ?Shortness of breath upon exertion:    ?Short of breath when lying flat:    ?Irregular heart rhythm:    ?    ?Vascular    ?Pain in calf, thigh, or hip brought on by ambulation:    ?Pain in feet at night that wakes you up from your sleep:     ?Blood clot in your veins:    ?Leg swelling:     ?    ? ? ?PHYSICAL EXAM: ?Vitals:  ? 06/05/21 0833  ?BP: (!) 155/85  ?Pulse: (!) 58  ?Resp: 14  ?Temp: (!) 97.2 ?F (36.2 ?C)  ?TempSrc: Temporal  ?SpO2: 98%  ?Weight: 175 lb 6.4 oz (79.6 kg)  ?Height: '5\' 9"'$  (1.753 m)  ? ? ?GENERAL: The patient is a well-nourished male, in no acute distress. The vital signs are documented above. ?CARDIOVASCULAR: Abdomen soft and nontender.  He has no pulsatile mass.  There is a well-healed groin incision on the left and normal femoral and 2-3+ popliteal pulses bilaterally. ?PULMONARY: There is good air exchange  ?MUSCULOSKELETAL: There are  no major deformities or cyanosis. ?NEUROLOGIC: No focal weakness or paresthesias are detected. ?SKIN: There are no ulcers or rashes noted. ?PSYCHIATRIC: The patient has a normal affect. ? ?DATA:  ?Duplex today of his aorta reveals maximal diameter of 5.1 cm.  CT scan in December 2021 revealed maximal diameter of 5.7 cm. ? ?MEDICAL ISSUES: ?Stable repair of infrarenal abdominal arctic aneurysm with stent graft.  Discussed the ultrasound results with him and his wife today.  He has actually had significant decrease in sac size which is encouraging.  I recommended that we see him in 2 years with repeat ultrasound of his endovascular repair ? ? ? ?Rosetta Posner, MD FACS ?[facility] [place] ?Office Tel [phone] ? ?Note: Portions of this report may have been transcribed using voice recognition software.  Every effort has been made to ensure  accuracy; however, inadvertent computerized transcription errors may still be present. ?

## 2022-02-19 IMAGING — CT CT CTA ABD/PEL W/CM AND/OR W/O CM
2 of 7 series · 14 of 46 positions shown, 16 images · IV contrast (Omnipaque or Isovue)
Comparison: None.

CLINICAL DATA: 78-year-old male with history of abdominal aortic
aneurysm.

EXAM:
CTA ABDOMEN AND PELVIS WITH CONTRAST
TECHNIQUE: Multidetector CT imaging of the abdomen and pelvis was performed
using the standard protocol during bolus administration of
intravenous contrast. Multiplanar reconstructed images and MIPs were
obtained and reviewed to evaluate the vascular anatomy.
CONTRAST:  100mL OMNIPAQUE IOHEXOL 350 MG/ML SOLN

[Series 4: arterial · axial · arterial · 0.66mm/px · z∈[+346,+778]mm · 11 of 163 slices shown, 13 images]
[im 10/163  soft-tissue]
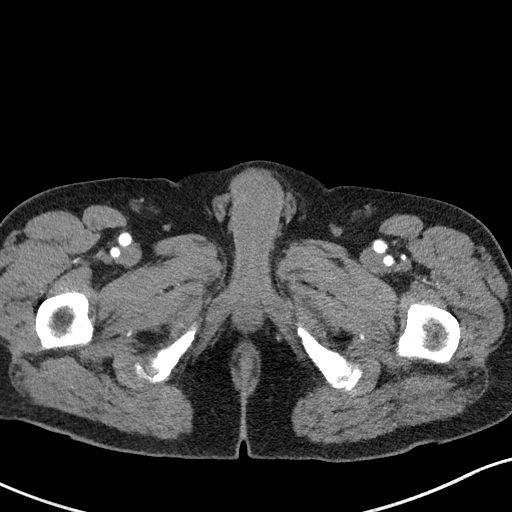
[im 10/163  bone]
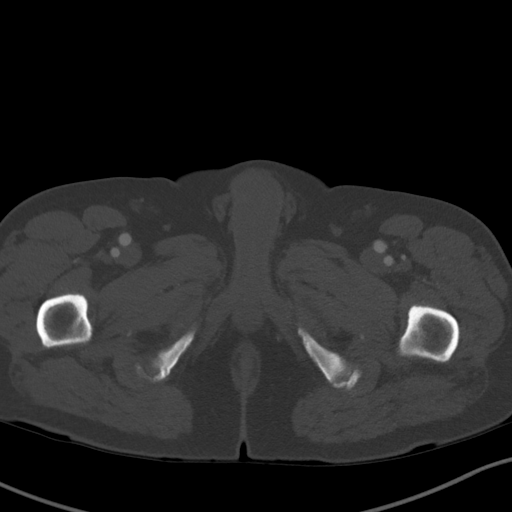
[im 28/163  soft-tissue]
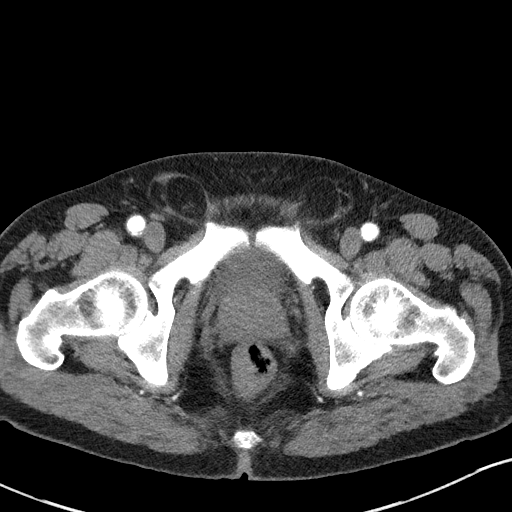
[im 37/163  soft-tissue]
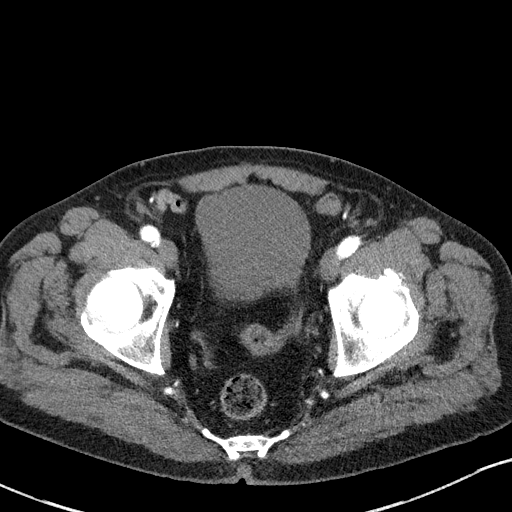
[im 55/163  soft-tissue]
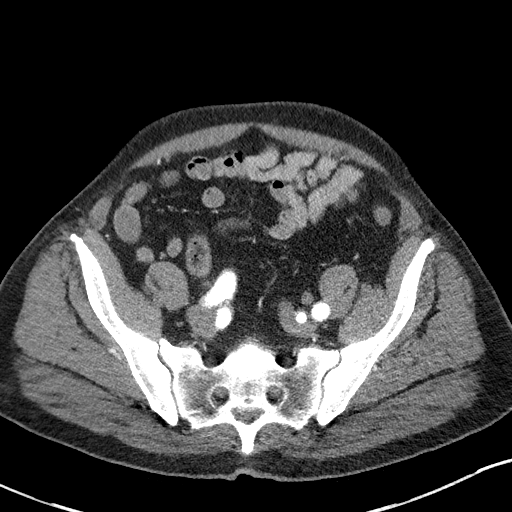
[im 64/163  soft-tissue]
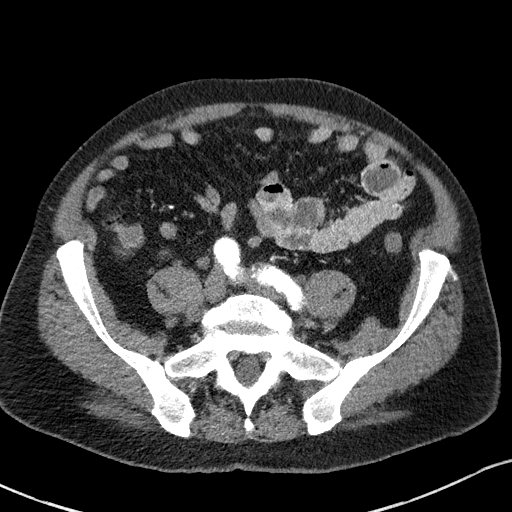
[im 82/163  soft-tissue]
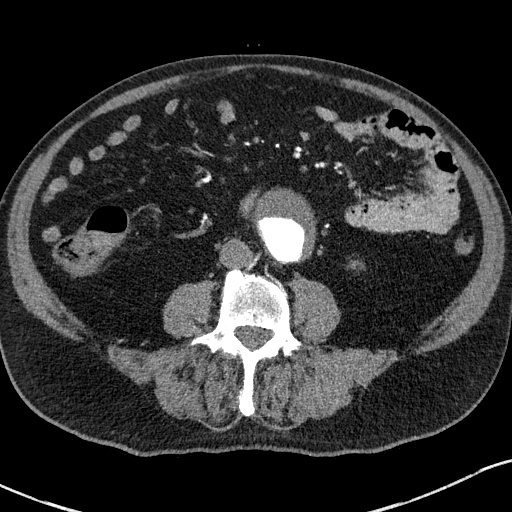
[im 100/163  soft-tissue]
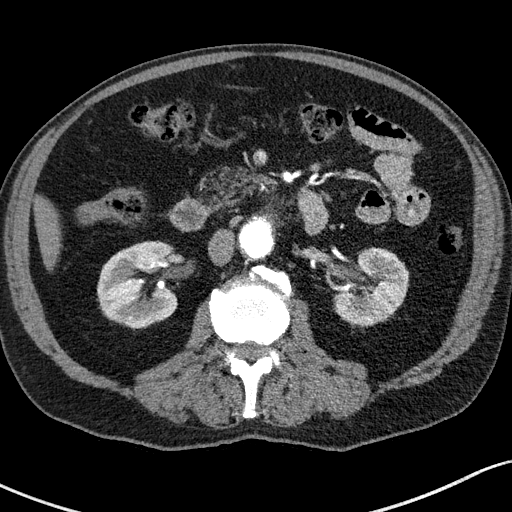
[im 109/163  soft-tissue]
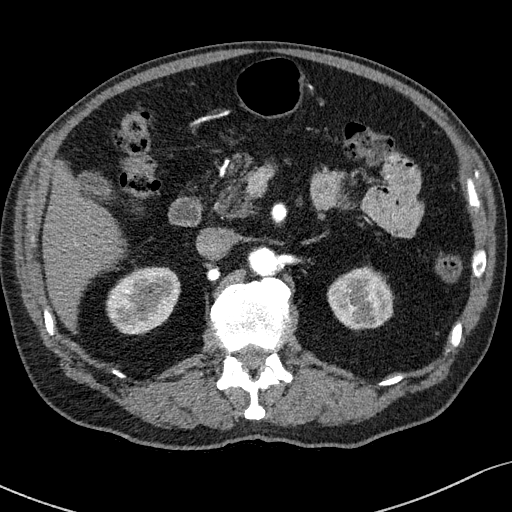
[im 127/163  soft-tissue]
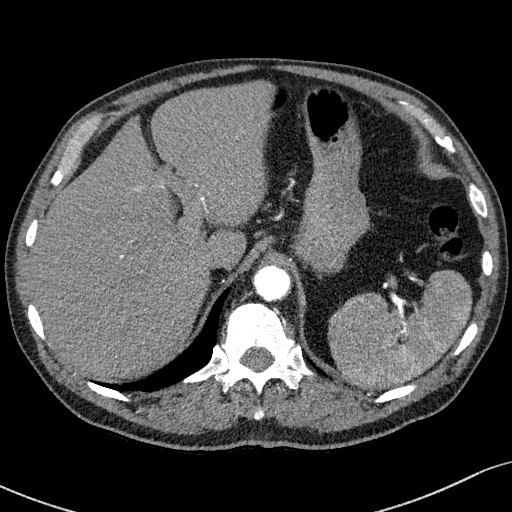
[im 127/163  bone]
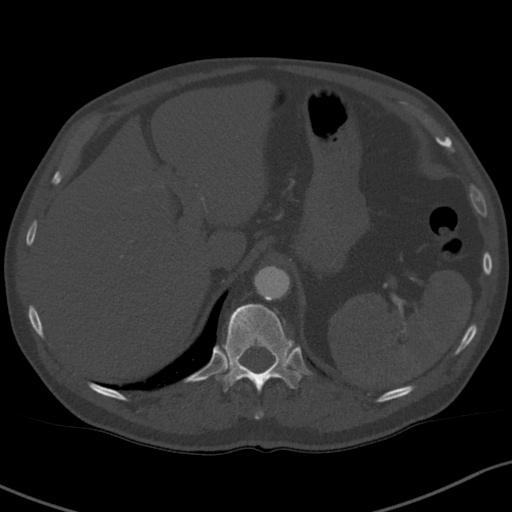
[im 136/163  soft-tissue]
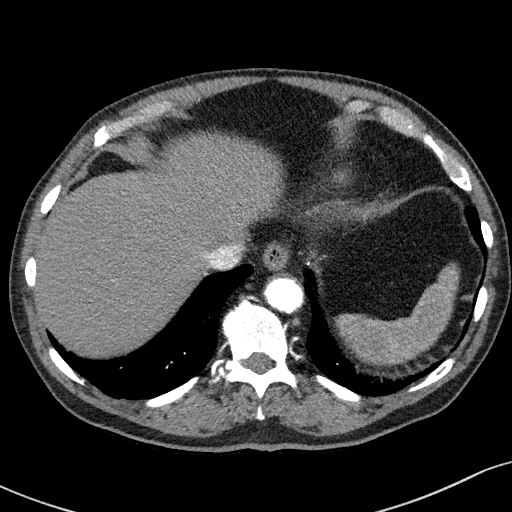
[im 154/163  soft-tissue]
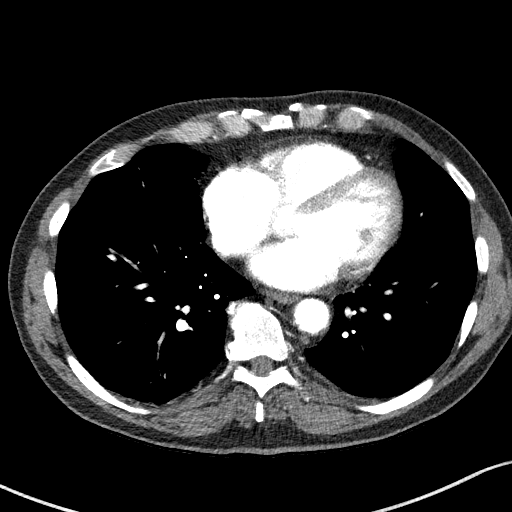

[Series 8: cor arterial · coronal · arterial · 0.77mm/px · 3 of 159 slices shown]
[im 40/159  soft-tissue]
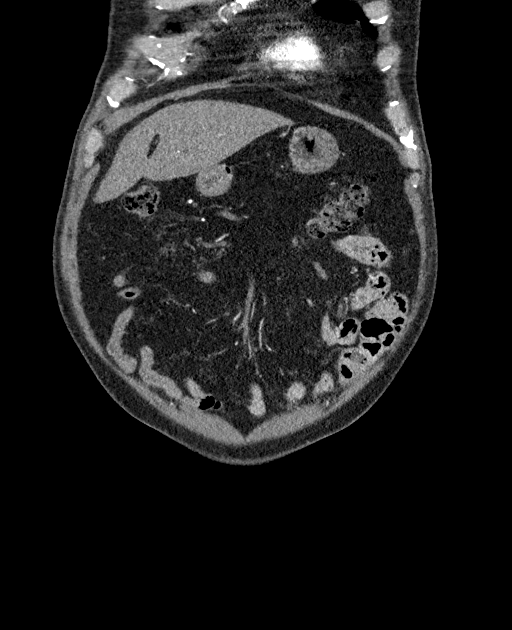
[im 80/159  soft-tissue]
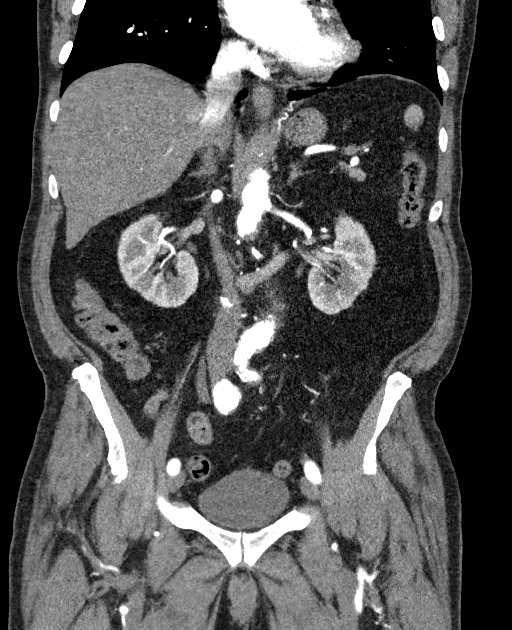
[im 119/159  soft-tissue]
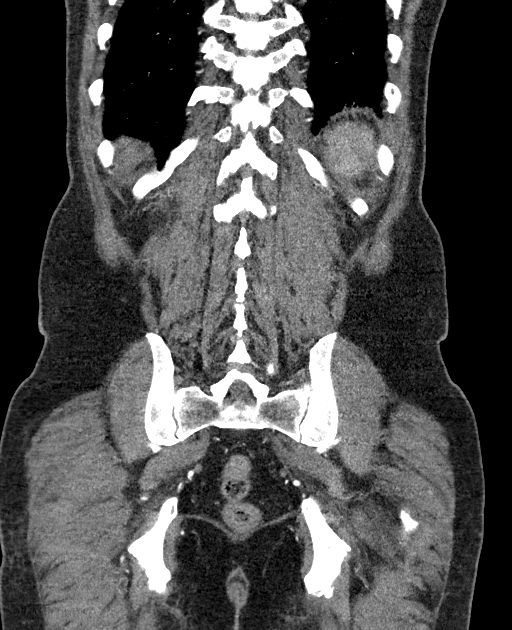

[14 of 46 positions shown; findings below may reference images not displayed]

FINDINGS: VASCULAR

Aorta: Infrarenal abdominal aortic aneurysm measuring up to 5.9 x
5.3 cm in maximum axial dimension. There is crescentic mural
thrombus within the aneurysm sac. Scattered atherosclerotic changes
throughout the abdominal aorta with coarse calcific plaque at the
bifurcation.

Celiac: Severe ostial narrowing without significant surrounding
atherosclerotic plaque. There is associated poststenotic dilation of
the celiac trunk which is widely patent. There are separate origins
of the right and left hepatic arteries from the celiac trunk.

SMA: Mild ostial narrowing with associated atherosclerotic plaque.
Proximal branch vessels are widely patent.

Renals: Single bilateral renal arteries are patent without evidence
of aneurysm, dissection, vasculitis, fibromuscular dysplasia or
significant stenosis.

IMA: At least moderate ostial stenosis this arises along the
inferior aspect of the partially thrombosed aneurysm sac.

Inflow: Mild stenosis of the proximal right common iliac artery
secondary to atherosclerotic plaque. There is diffuse ectasia of the
right common iliac artery measuring up to 18 mm in maximal diameter.
There is a saccular, posteriorly oriented aneurysm of the distal
right common iliac artery with the mouth measuring approximately 13
mm and outpouching approximately 11 mm. The right internal external
iliac arteries are patent. There is fusiform ectasia of the distal
left common iliac artery measuring up to 15 mm. The internal and
external iliac arteries are patent.

Proximal Outflow: Patent.

Veins: No obvious venous abnormality within the limitations of this
arterial phase study.

Review of the MIP images confirms the above findings.

NON-VASCULAR

Lower chest: No acute abnormality.

Hepatobiliary: No focal liver abnormality is seen. No gallstones,
gallbladder wall thickening, or biliary dilatation.

Pancreas: Diffuse fatty atrophy. No pancreatic ductal dilatation or
surrounding inflammatory changes.

Spleen: Normal in size without focal abnormality.

Adrenals/Urinary Tract: Adrenal glands are unremarkable. Kidneys are
normal, without renal calculi, focal lesion, or hydronephrosis.
Bladder is unremarkable.

Stomach/Bowel: Stomach is within normal limits. Appendix appears
normal. No evidence of bowel wall thickening, distention, or
inflammatory changes.

Lymphatic: No abdominopelvic lymphadenopathy.

Reproductive: The prostate is mildly enlarged measuring up to 5 cm.

Other: Tiny fat containing umbilical hernia. Small bilateral fat
containing inguinal hernias. No ascites.

Musculoskeletal: No acute or significant osseous findings.
IMPRESSION: VASCULAR

1. Infrarenal abdominal aortic aneurysm measuring up to 5.9 cm.
Aortic aneurysm NOS (N7G7W-[id number].F). Aortic Atherosclerosis
([id number]).
2. Saccular aneurysm measuring up to 11 mm arising posteriorly from
the ectatic distal right common iliac artery.
3. Severe ostial narrowing of the celiac trunk without significant
associated atherosclerotic change as could be seen with median
arcuate ligament compression. There is additional mild narrowing of
the SMA ostium.
4. At least moderate stenosis of the proximal inferior mesenteric
artery due to origination from the inferior aspect of the partially
thrombosed abdominal aortic aneurysm.

NON-VASCULAR

1. Mild prostatomegaly.
2. Tiny fat containing umbilical hernia.

## 2022-03-05 IMAGING — DX DG CHEST 1V PORT
1 series · 1 of 1 positions shown · non-contrast
Comparison: None.

CLINICAL DATA: Aortic aneurysm repair.

EXAM:
PORTABLE CHEST 1 VIEW

[chest ap]
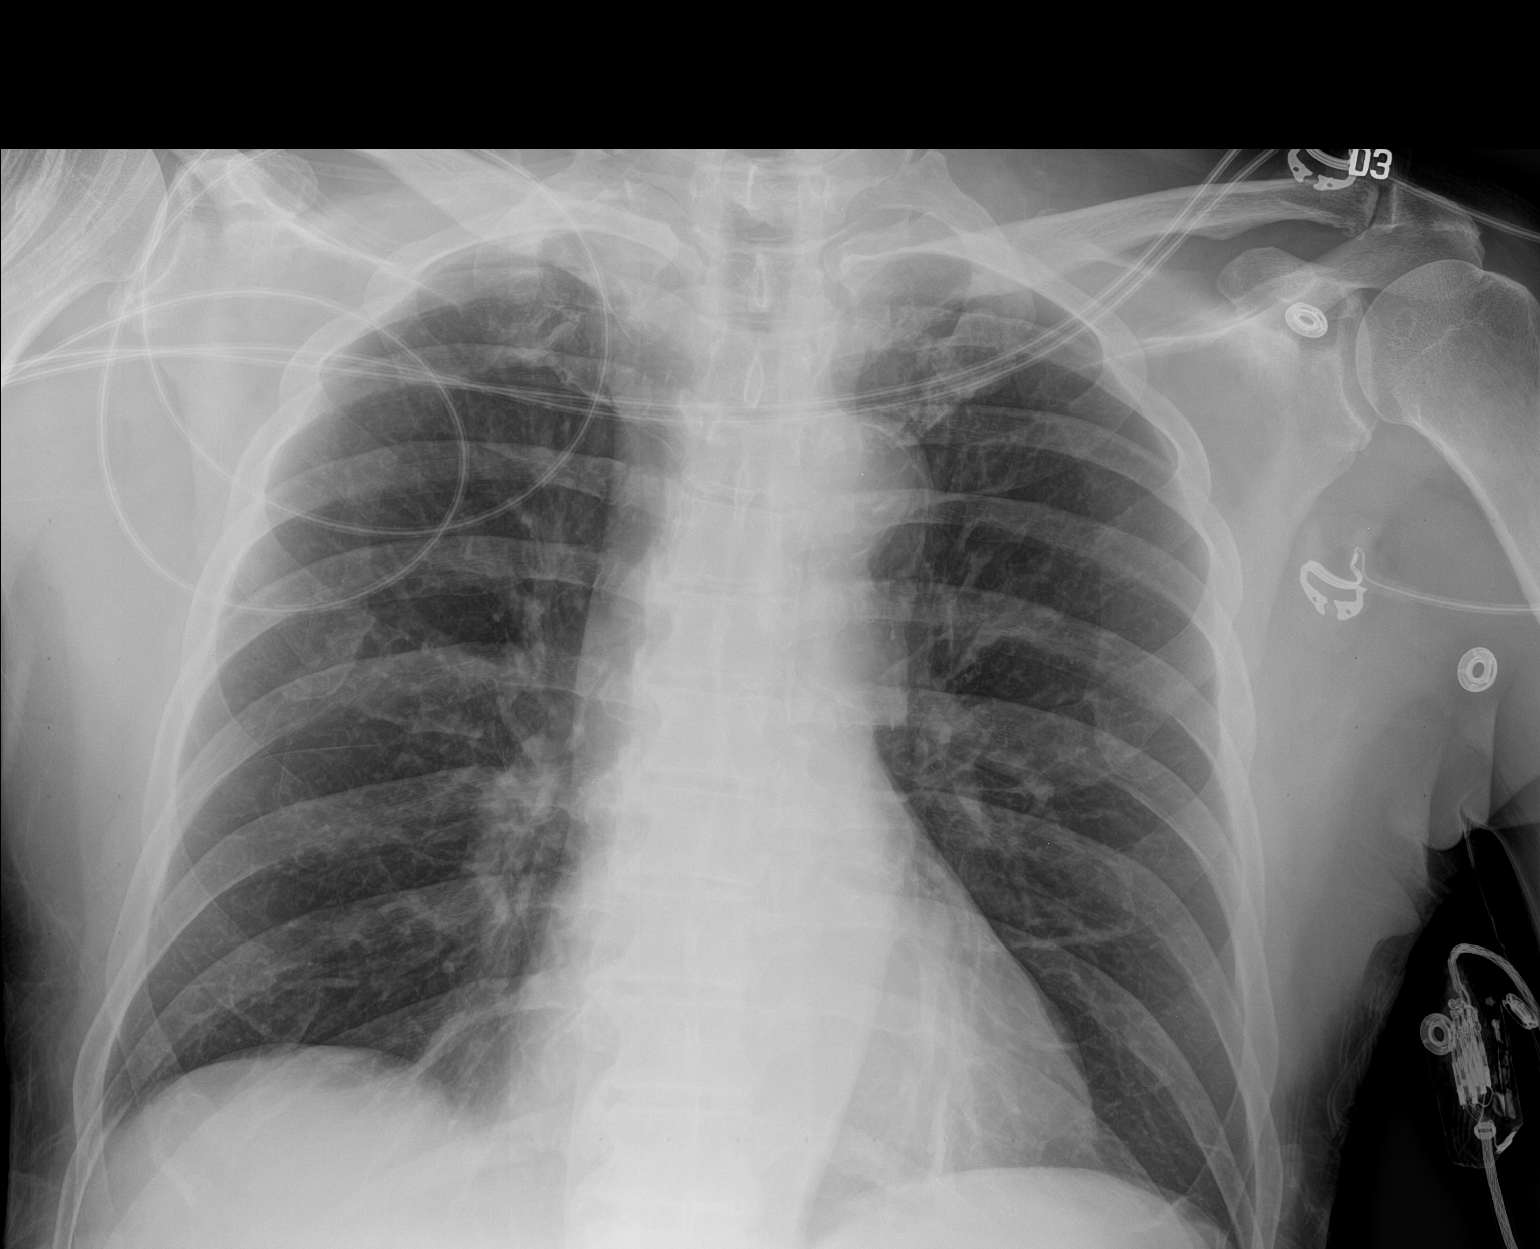

[1 of 1 positions shown; findings below may reference images not displayed]

FINDINGS: Heart size is normal. Thoracic aortic atherosclerosis. The lungs are
clear except for mild scarring at the left base. No pulmonary edema
or pleural effusion. No acute bone finding.
IMPRESSION: No active disease. Thoracic aortic atherosclerosis.

## 2022-12-16 NOTE — Progress Notes (Signed)
Fax received from [facility] on 12/04/22 for medical clearance/medication hold for left carpal tunnel release to be signed by T. Lenell Antu, MD.  Provider signed on 12/08/22, scanned into pt's chart, media routed via MyChart to sender on 12/08/22.

## 2023-02-20 ENCOUNTER — Telehealth: Payer: Self-pay

## 2023-02-20 DIAGNOSIS — Z95828 Presence of other vascular implants and grafts: Secondary | ICD-10-CM

## 2023-02-20 NOTE — Telephone Encounter (Signed)
Caller: Patient  Concern: Incision on R side is puffy and raised. States there is an odd sensation in his abdomen and when he lays flat, he feels a firmness just below the incision. Pt denies pain.  Location: abdomen  Description:  x 2 months  Resolution: Appointment scheduled for a non-urgent spot available on Dr. Ophelia Charter office day  Next Appt: Appointment scheduled for 12/17 @ 0830 for [facility] [place] and 0915 for PA  Pt informed of [place] office, but wants to be seen in [place] this time and go to [place] for surveillance moving forward.

## 2023-03-03 ENCOUNTER — Ambulatory Visit: Payer: Medicare PPO | Admitting: Physician Assistant

## 2023-03-03 ENCOUNTER — Ambulatory Visit (HOSPITAL_COMMUNITY)
Admission: RE | Admit: 2023-03-03 | Discharge: 2023-03-03 | Disposition: A | Discharge status: Home / Self Care | Payer: Medicare PPO | Source: Ambulatory Visit | Attending: Vascular Surgery | Admitting: Vascular Surgery

## 2023-03-03 VITALS — BP 152/78 | HR 68 | Temp 97.7°F | Resp 18 | Ht 69.0 in | Wt 180.8 lb

## 2023-03-03 DIAGNOSIS — K4020 Bilateral inguinal hernia, without obstruction or gangrene, not specified as recurrent: Secondary | ICD-10-CM | POA: Diagnosis not present

## 2023-03-03 DIAGNOSIS — Z95828 Presence of other vascular implants and grafts: Secondary | ICD-10-CM | POA: Diagnosis not present

## 2023-03-03 NOTE — Progress Notes (Signed)
Office Note     CC:  follow up Requesting Provider:  Serafina Royals, MD  HPI: George Walsh is a 82 y.o. (10/05/1940) male who presents for surveillance of endovascular repair of abdominal aortic aneurysm which was performed by Dr. Arbie Cookey in November 2021.  He was added onto clinic schedule as an urgent triage patient due to noticeable "puffiness" in both groins.  He is concerned this may be related to his surgery.  Right groin incision was closed with a closure device.  He required a cutdown in the left groin to repair the common femoral artery after sheath was removed.  He denies any painful episodes or discomfort in his groins.  He denies any new or changing abdominal or back pain.  He is ambulatory without claudication, rest pain, or tissue loss.  He is on a daily aspirin and statin.  He denies tobacco use.  Past Medical History:  Diagnosis Date   AAA (abdominal aortic aneurysm) (HCC)    Arthritis    Cancer (HCC)    skin cancer   Cataract    Color blind    Coronary artery disease    11/05/07 ([facility]): DES pLAD, severe DIAG stenosis not amenabale to PCI given small size, occluded RCA with left-to-right collaterals, unsuccessful attempt at PCI   GERD (gastroesophageal reflux disease)    Hyperlipidemia    Hypertension    Stroke [facility])     Past Surgical History:  Procedure Laterality Date   ABDOMINAL AORTIC ENDOVASCULAR STENT GRAFT N/A 01/27/2020   Procedure: ABDOMINAL AORTIC ENDOVASCULAR STENT GRAFT;  Surgeon: Larina Earthly, MD;  Location: [facility] OR;  Service: Vascular;  Laterality: N/A;   ARTERY REPAIR Left 01/27/2020   Procedure: ARTERY REPAIR-LEFT FEMORAL ARTERY;  Surgeon: Larina Earthly, MD;  Location: [facility] OR;  Service: Vascular;  Laterality: Left;   CATARACT EXTRACTION, BILATERAL Bilateral 2017   COLONOSCOPY W/ BIOPSIES     CYSTOSCOPY N/A 01/27/2020   Procedure: CYSTOSCOPY with difficult foley catheter placement over a wire;  Surgeon: Jannifer Hick, MD;  Location: [facility] OR;  Service:  Urology;  Laterality: N/A;   EYE SURGERY Bilateral    cataracts   OTHER SURGICAL HISTORY     stent placed unsure exactly where   OTHER SURGICAL HISTORY     Stent - BMS single   ULTRASOUND GUIDANCE FOR VASCULAR ACCESS Bilateral 01/27/2020   Procedure: ULTRASOUND GUIDANCE FOR VASCULAR ACCESS;  Surgeon: Larina Earthly, MD;  Location: [facility] OR;  Service: Vascular;  Laterality: Bilateral;    Social History   Socioeconomic History   Marital status: Married    Spouse name: Not on file   Number of children: Not on file   Years of education: Not on file   Highest education level: Not on file  Occupational History   Not on file  Tobacco Use   Smoking status: Never   Smokeless tobacco: Never  Vaping Use   Vaping status: Never Used  Substance and Sexual Activity   Alcohol use: Never   Drug use: Never   Sexual activity: Yes  Other Topics Concern   Not on file  Social History Narrative   Not on file   Social Drivers of Health   Financial Resource Strain: Not on file  Food Insecurity: Not on file  Transportation Needs: Not on file  Physical Activity: Not on file  Stress: Not on file  Social Connections: Unknown (07/29/2021)   Received from [facility]   Social Network    Social Network:  Not on file  Intimate Partner Violence: Unknown (06/27/2021)   Received [facility]   HITS    Physically Hurt: Not on file    Insult or Talk Down To: Not on file    Threaten Physical Harm: Not on file    Scream or Curse: Not on file   History reviewed. No pertinent family history.  Current Outpatient Medications  Medication Sig Dispense Refill   aspirin 81 MG EC tablet Take 81 mg by mouth daily.      carvedilol (COREG) 25 MG tablet Take 25 mg by mouth 2 (two) times daily with a meal.      losartan (COZAAR) 100 MG tablet Take 100 mg by mouth daily.      magnesium oxide (MAG-OX) 400 MG tablet Take 400 mg by mouth daily.      pantoprazole (PROTONIX) 40 MG tablet Take 40 mg by mouth  daily.      pravastatin (PRAVACHOL) 40 MG tablet Take 40 mg by mouth daily.      SF 5000 PLUS 1.1 % CREA dental cream Place 1 application onto teeth in the morning.     meloxicam (MOBIC) 7.5 MG tablet Take 7.5 mg by mouth daily as needed for pain. (Patient not taking: Reported on 03/03/2023)     oxyCODONE-acetaminophen (PERCOCET/ROXICET) 5-325 MG tablet Take 1 tablet by mouth every 6 (six) hours as needed for moderate pain. (Patient not taking: Reported on 03/03/2023) 15 tablet 0   No current facility-administered medications for this visit.    No Known Allergies   REVIEW OF SYSTEMS:   [X]  denotes positive finding, [ ]  denotes negative finding Cardiac  Comments:  Chest pain or chest pressure:    Shortness of breath upon exertion:    Short of breath when lying flat:    Irregular heart rhythm:        Vascular    Pain in calf, thigh, or hip brought on by ambulation:    Pain in feet at night that wakes you up from your sleep:     Blood clot in your veins:    Leg swelling:         Pulmonary    Oxygen at home:    Productive cough:     Wheezing:         Neurologic    Sudden weakness in arms or legs:     Sudden numbness in arms or legs:     Sudden onset of difficulty speaking or slurred speech:    Temporary loss of vision in one eye:     Problems with dizziness:         Gastrointestinal    Blood in stool:     Vomited blood:         Genitourinary    Burning when urinating:     Blood in urine:        Psychiatric    Major depression:         Hematologic    Bleeding problems:    Problems with blood clotting too easily:        Skin    Rashes or ulcers:        Constitutional    Fever or chills:      PHYSICAL EXAMINATION:  Vitals:   03/03/23 0859  BP: (!) 152/78  Pulse: 68  Resp: 18  Temp: 97.7 F (36.5 C)  TempSrc: Temporal  SpO2: 98%  Weight: 180 lb 12.8 oz (82 kg)  Height: 5\' 9"  (  1.753 m)    General:  WDWN in NAD; vital signs documented above Gait: Not  observed HENT: WNL, normocephalic Pulmonary: normal non-labored breathing , without Rales, rhonchi,  wheezing Cardiac: regular HR Abdomen: soft, NT, reducible left inguinal hernia; no noticeable hernia right groin Skin: without rashes Vascular Exam/Pulses: palpable DP pulses Extremities: without ischemic changes, without Gangrene , without cellulitis; without open wounds;  Musculoskeletal: no muscle wasting or atrophy  Neurologic: A&O X 3 Psychiatric:  The pt has Normal affect.   Non-Invasive Vascular Imaging:   EVAR duplex demonstrates 4.3 cm AAA sac in maximal diameter  No endoleak    ASSESSMENT/PLAN:: 82 y.o. male here for follow up for surveillance of EVAR as well as evaluation of "puffiness" in both groins  Bilateral lower extremities are well-perfused on exam with palpable DP pulses.  EVAR duplex demonstrates a successful repair of AAA.  AAA sac now measures 4.3 cm in size with no endoleak.  We will repeat EVAR duplex in 1 year per protocol.  Perioperative CT in 2021 demonstrated bilateral inguinal hernias.  The right inguinal hernia contained a small bowel loop and fat while the left only contained fat.  On exam he has a reducible hernia in his left groin.  I am unable to palpate a right groin hernia on exam.  He denies any painful episodes or discomfort associated with these hernias.  Recommendations included referral to general surgery for evaluation and potential repair however the patient would like to hold off on referral for now.  Should he develop any discomfort he will proceed with evaluation by general surgery.  I discussed the risks of incarceration of hernia with him.  I asked him to notify our office in the future if he would like to proceed with referral to general surgery.   Emilie Rutter, PA-C [facility] [phone]  Clinic MD:   [facility]

## 2023-03-05 ENCOUNTER — Other Ambulatory Visit: Payer: Self-pay

## 2023-03-05 DIAGNOSIS — Z95828 Presence of other vascular implants and grafts: Secondary | ICD-10-CM

## 2023-05-28 ENCOUNTER — Encounter (HOSPITAL_BASED_OUTPATIENT_CLINIC_OR_DEPARTMENT_OTHER): Payer: Self-pay | Admitting: Orthopedic Surgery

## 2023-06-03 ENCOUNTER — Encounter (HOSPITAL_BASED_OUTPATIENT_CLINIC_OR_DEPARTMENT_OTHER): Payer: Self-pay | Admitting: Orthopedic Surgery

## 2023-06-03 ENCOUNTER — Ambulatory Visit (HOSPITAL_BASED_OUTPATIENT_CLINIC_OR_DEPARTMENT_OTHER)

## 2023-06-03 ENCOUNTER — Other Ambulatory Visit: Payer: Self-pay

## 2023-06-03 ENCOUNTER — Ambulatory Visit (HOSPITAL_BASED_OUTPATIENT_CLINIC_OR_DEPARTMENT_OTHER)
Admission: RE | Admit: 2023-06-03 | Discharge: 2023-06-03 | Disposition: A | Discharge status: Home / Self Care | Attending: Orthopedic Surgery | Admitting: Orthopedic Surgery

## 2023-06-03 ENCOUNTER — Encounter (HOSPITAL_BASED_OUTPATIENT_CLINIC_OR_DEPARTMENT_OTHER)
Admission: RE | Disposition: A | Discharge status: Home / Self Care | Payer: Self-pay | Source: Home / Self Care | Attending: Orthopedic Surgery

## 2023-06-03 DIAGNOSIS — M1990 Unspecified osteoarthritis, unspecified site: Secondary | ICD-10-CM | POA: Insufficient documentation

## 2023-06-03 DIAGNOSIS — G5601 Carpal tunnel syndrome, right upper limb: Secondary | ICD-10-CM | POA: Insufficient documentation

## 2023-06-03 DIAGNOSIS — I2510 Atherosclerotic heart disease of native coronary artery without angina pectoris: Secondary | ICD-10-CM | POA: Insufficient documentation

## 2023-06-03 DIAGNOSIS — I10 Essential (primary) hypertension: Secondary | ICD-10-CM

## 2023-06-03 DIAGNOSIS — K219 Gastro-esophageal reflux disease without esophagitis: Secondary | ICD-10-CM | POA: Insufficient documentation

## 2023-06-03 DIAGNOSIS — Z955 Presence of coronary angioplasty implant and graft: Secondary | ICD-10-CM | POA: Diagnosis not present

## 2023-06-03 DIAGNOSIS — Z01818 Encounter for other preprocedural examination: Secondary | ICD-10-CM

## 2023-06-03 SURGERY — CARPAL TUNNEL RELEASE
Anesthesia: Monitor Anesthesia Care | Site: Wrist | Laterality: Right

## 2023-06-03 MED ORDER — DEXAMETHASONE SODIUM PHOSPHATE 10 MG/ML IJ SOLN
INTRAMUSCULAR | Status: AC
  Filled 2023-06-03: qty 1

## 2023-06-03 MED ORDER — DROPERIDOL 2.5 MG/ML IJ SOLN: 0.6250 mg | Freq: Once | INTRAMUSCULAR | Status: DC | PRN

## 2023-06-03 MED ORDER — CEFAZOLIN SODIUM-DEXTROSE 2-4 GM/100ML-% IV SOLN
2.0000 g | INTRAVENOUS | Status: AC
  Administered 2023-06-03: 2 g via INTRAVENOUS

## 2023-06-03 MED ORDER — OXYCODONE HCL 5 MG PO TABS: 5.0000 mg | ORAL_TABLET | Freq: Four times a day (QID) | ORAL | 0 refills | Status: AC | PRN

## 2023-06-03 MED ORDER — LIDOCAINE 2% (20 MG/ML) 5 ML SYRINGE
INTRAMUSCULAR | Status: AC
  Filled 2023-06-03: qty 5

## 2023-06-03 MED ORDER — FENTANYL CITRATE (PF) 100 MCG/2ML IJ SOLN
INTRAMUSCULAR | Status: AC
  Filled 2023-06-03: qty 2

## 2023-06-03 MED ORDER — LACTATED RINGERS IV SOLN: INTRAVENOUS | Status: DC

## 2023-06-03 MED ORDER — LIDOCAINE HCL (CARDIAC) PF 100 MG/5ML IV SOSY
PREFILLED_SYRINGE | INTRAVENOUS | Status: DC | PRN
Start: 2023-06-03 — End: 2023-06-03
  Administered 2023-06-03: 100 mg via INTRAVENOUS

## 2023-06-03 MED ORDER — ACETAMINOPHEN 10 MG/ML IV SOLN: 1000.0000 mg | Freq: Once | INTRAVENOUS | Status: DC | PRN

## 2023-06-03 MED ORDER — FENTANYL CITRATE (PF) 100 MCG/2ML IJ SOLN
INTRAMUSCULAR | Status: DC | PRN
  Administered 2023-06-03: 25 ug via INTRAVENOUS

## 2023-06-03 MED ORDER — BUPIVACAINE HCL (PF) 0.25 % IJ SOLN
INTRAMUSCULAR | Status: DC | PRN
  Administered 2023-06-03: 10 mL

## 2023-06-03 MED ORDER — OXYCODONE HCL 5 MG/5ML PO SOLN: 5.0000 mg | Freq: Once | ORAL | Status: DC | PRN

## 2023-06-03 MED ORDER — CEFAZOLIN SODIUM-DEXTROSE 2-4 GM/100ML-% IV SOLN
INTRAVENOUS | Status: AC
  Filled 2023-06-03: qty 100

## 2023-06-03 MED ORDER — ONDANSETRON HCL 4 MG/2ML IJ SOLN
INTRAMUSCULAR | Status: AC
  Filled 2023-06-03: qty 2

## 2023-06-03 MED ORDER — DEXAMETHASONE SODIUM PHOSPHATE 4 MG/ML IJ SOLN
INTRAMUSCULAR | Status: DC | PRN
Start: 2023-06-03 — End: 2023-06-03
  Administered 2023-06-03: 4 mg via INTRAVENOUS

## 2023-06-03 MED ORDER — PROPOFOL 10 MG/ML IV BOLUS
INTRAVENOUS | Status: DC | PRN
  Administered 2023-06-03: 30 mg via INTRAVENOUS

## 2023-06-03 MED ORDER — FENTANYL CITRATE (PF) 100 MCG/2ML IJ SOLN: 25.0000 ug | INTRAMUSCULAR | Status: DC | PRN

## 2023-06-03 MED ORDER — PROPOFOL 500 MG/50ML IV EMUL
INTRAVENOUS | Status: DC | PRN
Start: 2023-06-03 — End: 2023-06-03
  Administered 2023-06-03: 75 ug/kg/min via INTRAVENOUS

## 2023-06-03 MED ORDER — 0.9 % SODIUM CHLORIDE (POUR BTL) OPTIME
TOPICAL | Status: DC | PRN
  Administered 2023-06-03: 100 mL

## 2023-06-03 MED ORDER — OXYCODONE HCL 5 MG PO TABS: 5.0000 mg | ORAL_TABLET | Freq: Once | ORAL | Status: DC | PRN

## 2023-06-03 MED ORDER — ONDANSETRON HCL 4 MG/2ML IJ SOLN
INTRAMUSCULAR | Status: DC | PRN
  Administered 2023-06-03: 4 mg via INTRAVENOUS

## 2023-06-03 SURGICAL SUPPLY — 24 items
BLADE SURG 15 STRL LF DISP TIS (BLADE) ×1 IMPLANT
BNDG ELASTIC 3INX 5YD STR LF (GAUZE/BANDAGES/DRESSINGS) ×1 IMPLANT
BNDG ESMARK 4X9 LF (GAUZE/BANDAGES/DRESSINGS) ×1 IMPLANT
BNDG GAUZE DERMACEA FLUFF 4 (GAUZE/BANDAGES/DRESSINGS) ×1 IMPLANT
CHLORAPREP W/TINT 26 (MISCELLANEOUS) ×1 IMPLANT
CORD BIPOLAR FORCEPS 12FT (ELECTRODE) ×1 IMPLANT
COVER BACK TABLE 60X90IN (DRAPES) ×1 IMPLANT
CUFF TOURN SGL QUICK 18X4 (TOURNIQUET CUFF) ×1 IMPLANT
DRAPE EXTREMITY T 121X128X90 (DISPOSABLE) ×1 IMPLANT
DRAPE SURG 17X23 STRL (DRAPES) ×1 IMPLANT
GAUZE XEROFORM 1X8 LF (GAUZE/BANDAGES/DRESSINGS) ×1 IMPLANT
GLOVE BIO SURGEON STRL SZ7 (GLOVE) ×1 IMPLANT
GLOVE BIOGEL PI IND STRL 7.0 (GLOVE) ×1 IMPLANT
GOWN STRL REUS W/ TWL LRG LVL3 (GOWN DISPOSABLE) ×2 IMPLANT
NDL HYPO 25X1 1.5 SAFETY (NEEDLE) ×1 IMPLANT
NEEDLE HYPO 25X1 1.5 SAFETY (NEEDLE) ×1 IMPLANT
NS IRRIG 1000ML POUR BTL (IV SOLUTION) ×1 IMPLANT
PACK BASIN DAY SURGERY FS (CUSTOM PROCEDURE TRAY) ×1 IMPLANT
SHEET MEDIUM DRAPE 40X70 STRL (DRAPES) ×1 IMPLANT
SUT ETHILON 4 0 PS 2 18 (SUTURE) ×1 IMPLANT
SYR BULB EAR ULCER 3OZ GRN STR (SYRINGE) ×1 IMPLANT
SYR CONTROL 10ML LL (SYRINGE) ×1 IMPLANT
TOWEL GREEN STERILE FF (TOWEL DISPOSABLE) ×2 IMPLANT
UNDERPAD 30X36 HEAVY ABSORB (UNDERPADS AND DIAPERS) ×1 IMPLANT

## 2023-06-03 NOTE — Op Note (Signed)
   Date of Surgery: 06/03/2023  INDICATIONS: Patient is a 83 y.o.-year-old male with right carpal tunnel syndrome that has failed conservative management.  Risks, benefits, and alternatives to surgery were again discussed with the patient in the preoperative area. The patient wishes to proceed with surgery.  Informed consent was signed after our discussion.   PREOPERATIVE DIAGNOSIS:  Right carpal tunnel syndrome  POSTOPERATIVE DIAGNOSIS: Same.  PROCEDURE:  Right carpal tunnel release   SURGEON: Waylan Rocher, M.D.  ASSIST: None  ANESTHESIA:  Regional, MAC  IV FLUIDS AND URINE: See anesthesia.  ESTIMATED BLOOD LOSS: <5 mL.  IMPLANTS: * No implants in log *   DRAINS: None  COMPLICATIONS: None  DESCRIPTION OF PROCEDURE:  The patient was met in the preoperative holding area where the surgical site was marked and the informed consent form was signed.  The patient was then brought back to the operating room and remained on the stretcher.  A hand table was placed adjacent to the operative extremity and locked into place.  A tourniquet was placed on the right forearm.  A formal timeout was performed to confirm that this was the correct patient, surgical side, surgical site, and surgical procedure.  All were present and in agreement. Following formal timeout, a local block was performed using 10 mL of 0.25% plain marcaine.  The right upper extremity was then prepped and draped in the usual and sterile fashion.   Following a second timeout, the limb was exsanguinated and the tourniquet inflated to 250 mmHg.  A longitudinal incision was made in line with the radial border of the ring finger from distal to the wrist flexion crease to the intersection of Kaplan's cardinal line.  The skin and subcutaneous tissue was sharply divided.  The longitudinally running palmar fascia was incised.  The thenar musculature was bluntly swept off of the transverse carpal ligament.  The ligament was divided  from proximal to distal until the fat surrounding the palmar arch was encountered. Retractors were then placed in the proximal aspect of the wound to visualize the distal antebrachial fascia.  The fascia was sharply divided under direct visualization.   The wound was then thoroughly irrigated with sterile saline.  The tourniquet was deflated.  Hemostasis was achieved with direct pressure and bipolar electrocautery.  The wound was then closed with 4-0 nylon sutures in a horizontal mattress fashion. The wound was then dressed with xeroform, folded kerlix, and an ace wrap.  The patient was reversed from sedation and the drapes taken down.   All counts were correct x 2 at the end of the procedure.  The patient was then taken to the PACU in stable condition.     POSTOPERATIVE PLAN: He will be discharged to home with appropriate pain medication and discharge instructions.  I'll see him back in 10-14 days for his first postop visit.   Waylan Rocher, MD 11:37 AM

## 2023-06-03 NOTE — Interval H&P Note (Signed)
 History and Physical Interval Note:  06/03/2023 10:10 AM  George Walsh  has presented today for surgery, with the diagnosis of Right carpal tunnel syndrome.  The various methods of treatment have been discussed with the patient and family. After consideration of risks, benefits and other options for treatment, the patient has consented to  Procedure(s): CARPAL TUNNEL RELEASE (Right) as a surgical intervention.  The patient's history has been reviewed, patient examined, no change in status, stable for surgery.  I have reviewed the patient's chart and labs.  Questions were answered to the patient's satisfaction.     Delwin Raczkowski

## 2023-06-03 NOTE — H&P (Signed)
 HAND SURGERY   HPI: Patient is a 83 y.o. male who presents with right carpal tunnel syndrome that has failed conservative management.  Patient denies any changes to their medical history or new systemic symptoms today.    Past Medical History:  Diagnosis Date   AAA (abdominal aortic aneurysm) (HCC)    Arthritis    Cancer (HCC)    skin cancer   Cataract    Color blind    Coronary artery disease    11/05/07 ([facility]): DES pLAD, severe DIAG stenosis not amenabale to PCI given small size, occluded RCA with left-to-right collaterals, unsuccessful attempt at PCI   GERD (gastroesophageal reflux disease)    Hyperlipidemia    Hypertension    Past Surgical History:  Procedure Laterality Date   ABDOMINAL AORTIC ENDOVASCULAR STENT GRAFT N/A 01/27/2020   Procedure: ABDOMINAL AORTIC ENDOVASCULAR STENT GRAFT;  Surgeon: Larina Earthly, MD;  Location: [facility] OR;  Service: Vascular;  Laterality: N/A;   ARTERY REPAIR Left 01/27/2020   Procedure: ARTERY REPAIR-LEFT FEMORAL ARTERY;  Surgeon: Larina Earthly, MD;  Location: [facility] OR;  Service: Vascular;  Laterality: Left;   CATARACT EXTRACTION, BILATERAL Bilateral 2017   COLONOSCOPY W/ BIOPSIES     CYSTOSCOPY N/A 01/27/2020   Procedure: CYSTOSCOPY with difficult foley catheter placement over a wire;  Surgeon: Jannifer Hick, MD;  Location: [facility] OR;  Service: Urology;  Laterality: N/A;   EYE SURGERY Bilateral    cataracts   OTHER SURGICAL HISTORY     stent placed unsure exactly where   OTHER SURGICAL HISTORY     Stent - BMS single   ULTRASOUND GUIDANCE FOR VASCULAR ACCESS Bilateral 01/27/2020   Procedure: ULTRASOUND GUIDANCE FOR VASCULAR ACCESS;  Surgeon: Larina Earthly, MD;  Location: [facility] OR;  Service: Vascular;  Laterality: Bilateral;   Social History   Socioeconomic History   Marital status: Married    Spouse name: Not on file   Number of children: Not on file   Years of education: Not on file   Highest education level: Not on file  Occupational  History   Not on file  Tobacco Use   Smoking status: Never   Smokeless tobacco: Never  Vaping Use   Vaping status: Never Used  Substance and Sexual Activity   Alcohol use: Never   Drug use: Never   Sexual activity: Yes  Other Topics Concern   Not on file  Social History Narrative   Not on file   Social Drivers of Health   Financial Resource Strain: Not on file  Food Insecurity: Not on file  Transportation Needs: Not on file  Physical Activity: Not on file  Stress: Not on file  Social Connections: Unknown (07/29/2021)   Received from [facility]   Social Network    Social Network: Not on file   History reviewed. No pertinent family history. - negative except otherwise stated in the family history section No Known Allergies Prior to Admission medications   Medication Sig Start Date End Date Taking? Authorizing Provider  aspirin 81 MG EC tablet Take 81 mg by mouth daily.    Yes [provider]  carvedilol (COREG) 25 MG tablet Take 25 mg by mouth 2 (two) times daily with a meal.  11/20/17  Yes [provider]  losartan (COZAAR) 100 MG tablet Take 100 mg by mouth daily.  06/01/19  Yes [provider]  magnesium oxide (MAG-OX) 400 MG tablet Take 400 mg by mouth daily.    Yes [provider]  pantoprazole (PROTONIX) 40 MG tablet Take 40 mg by mouth daily.  11/30/19  Yes [provider]  pravastatin (PRAVACHOL) 40 MG tablet Take 40 mg by mouth daily.  08/05/19  Yes [provider]  SF 5000 PLUS 1.1 % CREA dental cream Place 1 application onto teeth in the morning. 12/07/19   [provider]   No results found. - Positive ROS: All other systems have been reviewed and were otherwise negative with the exception of those mentioned in the HPI and as above.  Physical Exam: General: No acute distress, resting comfortably Cardiovascular: BUE warm and well perfused, normal rate Respiratory: Normal WOB on RA Skin: Warm and  dry Neurologic: Sensation intact distally Psychiatric: Patient is at baseline mood and affect  Right Upper Extremity  Positive Tinel, Phalen, Durkan signs at the wrist.  No thenar atrophy but 4/5 thenar motor strength.  SILT m/u/r distribution.  Hand warm and well perfused w/ BCR.   Assessment: 83 yo M w/ R CTS that has failed conservative management.   Plan: OR today for right carpal tunnel release. We again reviewed the risks of surgery which include, but are not limited to, bleeding, infection, damage to neurovascular structures, persistent symptoms, pillar pain or scar sensitivity, delayed wound healing, need for additional surgery.  Informed consent was signed.  All questions were answered.   Marlyne Beards, M.D. [facility] 10:07 AM

## 2023-06-03 NOTE — Discharge Instructions (Addendum)
 Charles Benfield, M.D. Hand Surgery  POST-OPERATIVE DISCHARGE INSTRUCTIONS   PRESCRIPTIONS: - You may have been given a prescription to be taken as directed for post-operative pain control.  You may also take over the counter ibuprofen/aleve and tylenol for pain. Take this as directed on the packaging. Do not exceed 3000 mg tylenol/acetaminophen in 24 hours.  Ibuprofen 600-800 mg (3-4) tablets by mouth every 6 hours as needed for pain.   OR  Aleve 2 tablets by mouth every 12 hours (twice daily) as needed for pain.   AND/OR  Tylenol 1000 mg (2 tablets) every 8 hours as needed for pain.  - Please use your pain medication carefully, as refills are limited and you may not be provided with one.  As stated above, please use over the counter pain medicine - it will also be helpful with decreasing your swelling.    ANESTHESIA: -After your surgery, post-surgical discomfort or pain is likely. This discomfort can last several days to a few weeks. At certain times of the day your discomfort may be more intense.   Did you receive a nerve block?   - A nerve block can provide pain relief for one hour to two days after your surgery. As long as the nerve block is working, you will experience little or no sensation in the area the surgeon operated on.  - As the nerve block wears off, you will begin to experience pain or discomfort. It is very important that you begin taking your prescribed pain medication before the nerve block fully wears off. Treating your pain at the first sign of the block wearing off will ensure your pain is better controlled and more tolerable when full-sensation returns. Do not wait until the pain is intolerable, as the medicine will be less effective. It is better to treat pain in advance than to try and catch up.   General Anesthesia:  If you did not receive a nerve block during your surgery, you will need to start taking your pain medication shortly after your surgery and  should continue to do so as prescribed by your surgeon.     ICE AND ELEVATION: - You may use ice for the first 48-72 hours, but it is not critical.   - Motion of your fingers is very important to decrease the swelling.  - Elevation, as much as possible for the next 48 hours, is critical for decreasing swelling as well as for pain relief. Elevation means when you are seated or lying down, you hand should be at or above your heart. When walking, the hand needs to be at or above the level of your elbow.  - If the bandage gets too tight, it may need to be loosened. Please contact our office and we will instruct you in how to do this.    SURGICAL BANDAGES:  - Keep your dressing and/or splint clean and dry at all times.  You can remove your dressing 7 days from now and change with a dry dressing or Band-Aids as needed thereafter. - You may place a plastic bag over your bandage to shower, but be careful, do not get your bandages wet.  - After the bandages have been removed, it is OK to get the stitches wet in a shower or with hand washing. Do Not soak or submerge the wound yet. Please do not use lotions or creams on the stitches.      HAND THERAPY:  - You may not need any. If you   do, we will begin this at your follow up visit in the clinic.    ACTIVITY AND WORK: - You are encouraged to move any fingers which are not in the bandage.  - Light use of the fingers is allowed to assist the other hand with daily hygiene and eating, but strong gripping or lifting is often uncomfortable and should be avoided.  - You might miss a variable period of time from work and hopefully this issue has been discussed prior to surgery. You may not do any heavy work with your affected hand for about 2 weeks.    [facility] Second Floor, [address] , [place] [address] [phone]    Post Anesthesia Home Care Instructions  Activity: Get plenty of rest for the remainder of the day. A  responsible individual must stay with you for 24 hours following the procedure.  For the next 24 hours, DO NOT: -Drive a car -Operate machinery -Drink alcoholic beverages -Take any medication unless instructed by your physician -Make any legal decisions or sign important papers.  Meals: Start with liquid foods such as gelatin or soup. Progress to regular foods as tolerated. Avoid greasy, spicy, heavy foods. If nausea and/or vomiting occur, drink only clear liquids until the nausea and/or vomiting subsides. Call your physician if vomiting continues.  Special Instructions/Symptoms: Your throat may feel dry or sore from the anesthesia or the breathing tube placed in your throat during surgery. If this causes discomfort, gargle with warm salt water. The discomfort should disappear within 24 hours.     

## 2023-06-03 NOTE — Transfer of Care (Signed)
 Immediate Anesthesia Transfer of Care Note  Patient: George Walsh  Procedure(s) Performed: CARPAL TUNNEL RELEASE (Right: Wrist)  Patient Location: PACU  Anesthesia Type:MAC  Level of Consciousness: awake, alert , and patient cooperative  Airway & Oxygen Therapy: Patient Spontanous Breathing and Patient connected to face mask oxygen  Post-op Assessment: Report given to RN and Post -op Vital signs reviewed and stable  Post vital signs: Reviewed and stable  Last Vitals:  Vitals Value Taken Time  BP 111/72 06/03/23 1140  Temp    Pulse 54 06/03/23 1142  Resp 14 06/03/23 1142  SpO2 98 % 06/03/23 1142  Vitals shown include unfiled device data.  Last Pain:  Vitals:   06/03/23 1001  TempSrc: Oral  PainSc: 0-No pain      Patients Stated Pain Goal: 7 (06/03/23 1001)  Complications: No notable events documented.

## 2023-06-03 NOTE — Anesthesia Postprocedure Evaluation (Signed)
 Anesthesia Post Note  Patient: George Walsh  Procedure(s) Performed: CARPAL TUNNEL RELEASE (Right: Wrist)     Patient location during evaluation: PACU Anesthesia Type: MAC Level of consciousness: awake and alert Pain management: pain level controlled Vital Signs Assessment: post-procedure vital signs reviewed and stable Respiratory status: spontaneous breathing, nonlabored ventilation, respiratory function stable and patient connected to nasal cannula oxygen Cardiovascular status: blood pressure returned to baseline and stable Postop Assessment: no apparent nausea or vomiting Anesthetic complications: no   No notable events documented.  Last Vitals:  Vitals:   06/03/23 1145 06/03/23 1200  BP: 123/75 136/85  Pulse: (!) 59 (!) 56  Resp: 15 17  Temp:    SpO2: 98% 96%    Last Pain:  Vitals:   06/03/23 1200  TempSrc:   PainSc: 0-No pain                 [place]

## 2023-06-03 NOTE — Anesthesia Preprocedure Evaluation (Signed)
 Anesthesia Evaluation  Patient identified by MRN, date of birth, ID band Patient awake    Reviewed: Allergy & Precautions, H&P , NPO status , Patient's Chart, lab work & pertinent test results  Airway Mallampati: II  TM Distance: >3 FB Neck ROM: Full    Dental no notable dental hx.    Pulmonary neg pulmonary ROS   Pulmonary exam normal breath sounds clear to auscultation       Cardiovascular hypertension, + CAD and + Cardiac Stents  Normal cardiovascular exam Rhythm:Regular Rate:Normal  AAA   Neuro/Psych negative neurological ROS  negative psych ROS   GI/Hepatic Neg liver ROS,GERD  ,,  Endo/Other  negative endocrine ROS    Renal/GU negative Renal ROS  negative genitourinary   Musculoskeletal  (+) Arthritis ,    Abdominal   Peds negative pediatric ROS (+)  Hematology negative hematology ROS (+)   Anesthesia Other Findings carpal tunnel syndrome  Reproductive/Obstetrics negative OB ROS                             Anesthesia Physical Anesthesia Plan  ASA: 3  Anesthesia Plan: MAC   Post-op Pain Management:    Induction: Intravenous  PONV Risk Score and Plan: Propofol infusion, Treatment may vary due to age or medical condition and Ondansetron  Airway Management Planned: Natural Airway  Additional Equipment:   Intra-op Plan:   Post-operative Plan:   Informed Consent: I have reviewed the patients History and Physical, chart, labs and discussed the procedure including the risks, benefits and alternatives for the proposed anesthesia with the patient or authorized representative who has indicated his/her understanding and acceptance.     Dental advisory given  Plan Discussed with: CRNA  Anesthesia Plan Comments:        Anesthesia Quick Evaluation

## 2023-06-04 ENCOUNTER — Encounter (HOSPITAL_BASED_OUTPATIENT_CLINIC_OR_DEPARTMENT_OTHER): Payer: Self-pay | Admitting: Orthopedic Surgery

## 2024-04-20 ENCOUNTER — Ambulatory Visit (HOSPITAL_BASED_OUTPATIENT_CLINIC_OR_DEPARTMENT_OTHER): Admitting: Anesthesiology

## 2024-04-20 ENCOUNTER — Other Ambulatory Visit: Payer: Self-pay

## 2024-04-20 ENCOUNTER — Encounter (HOSPITAL_BASED_OUTPATIENT_CLINIC_OR_DEPARTMENT_OTHER): Payer: Self-pay | Admitting: Orthopedic Surgery

## 2024-04-20 ENCOUNTER — Ambulatory Visit (HOSPITAL_BASED_OUTPATIENT_CLINIC_OR_DEPARTMENT_OTHER): Admission: RE | Admit: 2024-04-20 | Source: Home / Self Care | Admitting: Orthopedic Surgery

## 2024-04-20 ENCOUNTER — Encounter (HOSPITAL_BASED_OUTPATIENT_CLINIC_OR_DEPARTMENT_OTHER)
Admission: RE | Disposition: A | Discharge status: Home / Self Care | Payer: Self-pay | Source: Home / Self Care | Attending: Orthopedic Surgery

## 2024-04-20 DIAGNOSIS — Z01818 Encounter for other preprocedural examination: Secondary | ICD-10-CM

## 2024-04-20 MED ORDER — ACETAMINOPHEN 10 MG/ML IV SOLN
INTRAVENOUS | Status: AC
  Filled 2024-04-20: qty 100

## 2024-04-20 MED ORDER — LIDOCAINE HCL (CARDIAC) PF 100 MG/5ML IV SOSY
PREFILLED_SYRINGE | INTRAVENOUS | Status: DC | PRN
  Administered 2024-04-20: 30 mg via INTRAVENOUS

## 2024-04-20 MED ORDER — CEFAZOLIN SODIUM-DEXTROSE 2-4 GM/100ML-% IV SOLN
INTRAVENOUS | Status: AC
  Filled 2024-04-20: qty 100

## 2024-04-20 MED ORDER — KETOROLAC TROMETHAMINE 30 MG/ML IJ SOLN
INTRAMUSCULAR | Status: DC | PRN
  Administered 2024-04-20: 15 mg via INTRAVENOUS

## 2024-04-20 MED ORDER — OXYCODONE HCL 5 MG PO TABS: 5.0000 mg | ORAL_TABLET | Freq: Four times a day (QID) | ORAL | 0 refills | Status: AC | PRN

## 2024-04-20 MED ORDER — PROPOFOL 500 MG/50ML IV EMUL
INTRAVENOUS | Status: DC | PRN
  Administered 2024-04-20: 150 ug/kg/min via INTRAVENOUS

## 2024-04-20 MED ORDER — 0.9 % SODIUM CHLORIDE (POUR BTL) OPTIME
TOPICAL | Status: DC | PRN
  Administered 2024-04-20: 200 mL

## 2024-04-20 MED ORDER — OXYCODONE HCL 5 MG/5ML PO SOLN: 5.0000 mg | Freq: Once | ORAL | Status: DC | PRN

## 2024-04-20 MED ORDER — ONDANSETRON HCL 4 MG/2ML IJ SOLN
INTRAMUSCULAR | Status: DC | PRN
  Administered 2024-04-20: 4 mg via INTRAVENOUS

## 2024-04-20 MED ORDER — ACETAMINOPHEN 10 MG/ML IV SOLN
INTRAVENOUS | Status: DC | PRN
  Administered 2024-04-20: 1000 mg via INTRAVENOUS

## 2024-04-20 MED ORDER — CEFAZOLIN SODIUM-DEXTROSE 2-4 GM/100ML-% IV SOLN
2.0000 g | INTRAVENOUS | Status: AC
  Administered 2024-04-20: 2 g via INTRAVENOUS

## 2024-04-20 MED ORDER — OXYCODONE HCL 5 MG PO TABS: 5.0000 mg | ORAL_TABLET | Freq: Once | ORAL | Status: DC | PRN

## 2024-04-20 MED ORDER — LACTATED RINGERS IV SOLN: INTRAVENOUS | Status: DC

## 2024-04-20 MED ORDER — BUPIVACAINE HCL (PF) 0.25 % IJ SOLN
INTRAMUSCULAR | Status: DC | PRN
  Administered 2024-04-20: 10 mL

## 2024-04-20 MED ORDER — HYDROMORPHONE HCL 1 MG/ML IJ SOLN: 0.2500 mg | INTRAMUSCULAR | Status: DC | PRN

## 2024-04-20 NOTE — Op Note (Signed)
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  Risks, benefits, and alternatives to surgery were again discussed with the patient in the preoperative area. The patient wishes to proceed with surgery.  Informed consent was signed after our discussion.   PREOPERATIVE DIAGNOSIS:  Left carpal tunnel syndrome  POSTOPERATIVE DIAGNOSIS: Same.  PROCEDURE:  Left carpal tunnel release   SURGEON: Carlin Galla, M.D.  ASSIST: None  ANESTHESIA:  Local + MAC  IV FLUIDS AND URINE: See anesthesia.  ESTIMATED BLOOD LOSS: <5 mL.  IMPLANTS: * No implants in log *   DRAINS: None  COMPLICATIONS: None noted  DESCRIPTION OF PROCEDURE:  The patient was met in the preoperative holding area where the surgical site was marked and the informed consent form was signed.  The patient was then brought back to the operating room and remained on the stretcher.  A hand table was placed adjacent to the operative extremity and locked into place.  A tourniquet was placed on the left forearm.  A formal timeout was performed to confirm that this was the correct patient, surgical side, surgical site, and surgical procedure.  All were present and in agreement. Following formal timeout, a local block was performed using 10 mL of 0.25% plain marcaine .  The left upper extremity was then prepped and draped in the usual and sterile fashion.   Following a second timeout, the limb was exsanguinated and the tourniquet inflated to 250 mmHg.  A longitudinal incision was made in line with the radial border of the ring finger from distal to the wrist flexion crease to the intersection of Kaplan's cardinal line.  The skin and subcutaneous tissue was sharply divided.  The longitudinally running palmar fascia was incised.  The thenar musculature was bluntly swept off of the transverse carpal ligament.  The ligament was divided from  proximal to distal until the fat surrounding the palmar arch was encountered. Retractors were then placed in the proximal aspect of the wound to visualize the distal antebrachial fascia.  The fascia was sharply divided under direct visualization.   The wound was then thoroughly irrigated with sterile saline.  The tourniquet was deflated.  Hemostasis was achieved with direct pressure and bipolar electrocautery.  The wound was then closed with 4-0 vicryl rapide sutures in a horizontal mattress fashion. The wound was then dressed with xeroform, folded kerlix, and an ace wrap.  The patient was reversed from sedation and the drapes taken down.   All counts were correct x 2 at the end of the procedure.  The patient was then taken to the PACU in stable condition.    POSTOPERATIVE PLAN: He will be discharged to home with appropriate pain medication and discharge instructions.  I'll see him back in 10-14 days for his first postop visit.  Carlin Galla, MD 9:54 AM  "

## 2024-04-20 NOTE — Anesthesia Preprocedure Evaluation (Addendum)
 Anesthesia Evaluation  Patient identified by MRN, date of birth, ID band Patient awake    Reviewed: Allergy & Precautions, H&P , NPO status , Patient's Chart, lab work & pertinent test results  Airway Mallampati: II  TM Distance: >3 FB Neck ROM: Full    Dental no notable dental hx.    Pulmonary neg pulmonary ROS   Pulmonary exam normal breath sounds clear to auscultation       Cardiovascular hypertension, + CAD and + Cardiac Stents  Normal cardiovascular exam Rhythm:Regular Rate:Normal  AAA   Neuro/Psych negative neurological ROS  negative psych ROS   GI/Hepatic Neg liver ROS,GERD  ,,  Endo/Other  negative endocrine ROS    Renal/GU negative Renal ROS  negative genitourinary   Musculoskeletal  (+) Arthritis ,    Abdominal   Peds negative pediatric ROS (+)  Hematology negative hematology ROS (+)   Anesthesia Other Findings carpal tunnel syndrome  Reproductive/Obstetrics negative OB ROS                             Anesthesia Physical Anesthesia Plan  ASA: 3  Anesthesia Plan: MAC   Post-op Pain Management:    Induction: Intravenous  PONV Risk Score and Plan: Propofol infusion, Treatment may vary due to age or medical condition and Ondansetron  Airway Management Planned: Natural Airway  Additional Equipment:   Intra-op Plan:   Post-operative Plan:   Informed Consent: I have reviewed the patients History and Physical, chart, labs and discussed the procedure including the risks, benefits and alternatives for the proposed anesthesia with the patient or authorized representative who has indicated his/her understanding and acceptance.     Dental advisory given  Plan Discussed with: CRNA  Anesthesia Plan Comments:        Anesthesia Quick Evaluation

## 2024-04-20 NOTE — Transfer of Care (Signed)
 Immediate Anesthesia Transfer of Care Note  Patient: George Walsh  Procedure(s) Performed: CARPAL TUNNEL RELEASE (Left: Wrist)  Patient Location: PACU  Anesthesia Type:MAC  Level of Consciousness: awake and patient cooperative  Airway & Oxygen Therapy: Patient connected to face mask oxygen  Post-op Assessment: Report given to RN and Post -op Vital signs reviewed and stable  Post vital signs: Reviewed and stable  Last Vitals:  Vitals Value Taken Time  BP    Temp    Pulse    Resp    SpO2      Last Pain:  Vitals:   04/20/24 0855  TempSrc: Temporal  PainSc: 0-No pain      Patients Stated Pain Goal: 1 (04/20/24 0855)  Complications: No notable events documented.

## 2024-04-20 NOTE — Discharge Instructions (Addendum)
 "  Carlin Galla, M.D. Hand Surgery  POST-OPERATIVE DISCHARGE INSTRUCTIONS   PRESCRIPTIONS: - You may have been given a prescription to be taken as directed for post-operative pain control.  You may also take over the counter ibuprofen/aleve and tylenol  for pain. Take this as directed on the packaging. Do not exceed 3000 mg tylenol /acetaminophen  in 24 hours.  Ibuprofen 600-800 mg (3-4) tablets by mouth every 6 hours as needed for pain.   OR  Aleve 2 tablets by mouth every 12 hours (twice daily) as needed for pain.   AND/OR  Tylenol  1000 mg (2 tablets) every 8 hours as needed for pain.  - Please use your pain medication carefully, as refills are limited and you may not be provided with one.  As stated above, please use over the counter pain medicine - it will also be helpful with decreasing your swelling.    ANESTHESIA: -After your surgery, post-surgical discomfort or pain is likely. This discomfort can last several days to a few weeks. At certain times of the day your discomfort may be more intense.   Did you receive a nerve block?   - A nerve block can provide pain relief for one hour to two days after your surgery. As long as the nerve block is working, you will experience little or no sensation in the area the surgeon operated on.  - As the nerve block wears off, you will begin to experience pain or discomfort. It is very important that you begin taking your prescribed pain medication before the nerve block fully wears off. Treating your pain at the first sign of the block wearing off will ensure your pain is better controlled and more tolerable when full-sensation returns. Do not wait until the pain is intolerable, as the medicine will be less effective. It is better to treat pain in advance than to try and catch up.   General Anesthesia:  If you did not receive a nerve block during your surgery, you will need to start taking your pain medication shortly after your surgery and  should continue to do so as prescribed by your surgeon.     ICE AND ELEVATION: - You may use ice for the first 48-72 hours, but it is not critical.   - Motion of your fingers is very important to decrease the swelling.  - Elevation, as much as possible for the next 48 hours, is critical for decreasing swelling as well as for pain relief. Elevation means when you are seated or lying down, you hand should be at or above your heart. When walking, the hand needs to be at or above the level of your elbow.  - If the bandage gets too tight, it may need to be loosened. Please contact our office and we will instruct you in how to do this.    SURGICAL BANDAGES:  - Keep your dressing and/or splint clean and dry at all times.  You can remove your dressing 7 days from now and change with a dry dressing or Band-Aids as needed thereafter. - You may place a plastic bag over your bandage to shower, but be careful, do not get your bandages wet.  - After the bandages have been removed, it is OK to get the stitches wet in a shower or with hand washing. Do Not soak or submerge the wound yet. Please do not use lotions or creams on the stitches.      HAND THERAPY:  - You may not need any. If  you do, we will begin this at your follow up visit in the clinic.    ACTIVITY AND WORK: - You are encouraged to move any fingers which are not in the bandage.  - Light use of the fingers is allowed to assist the other hand with daily hygiene and eating, but strong gripping or lifting is often uncomfortable and should be avoided.  - You might miss a variable period of time from work and hopefully this issue has been discussed prior to surgery. You may not do any heavy work with your affected hand for about 2 weeks.    [facility] [address] Floor, [address] [place], [place] [address] [phone]    Post Anesthesia Home Care Instructions  Activity: Get plenty of rest for the remainder of the day. A  responsible individual must stay with you for 24 hours following the procedure.  For the next 24 hours, DO NOT: -Drive a car -Advertising copywriter -Drink alcoholic beverages -Take any medication unless instructed by your physician -Make any legal decisions or sign important papers.  Meals: Start with liquid foods such as gelatin or soup. Progress to regular foods as tolerated. Avoid greasy, spicy, heavy foods. If nausea and/or vomiting occur, drink only clear liquids until the nausea and/or vomiting subsides. Call your physician if vomiting continues.  Special Instructions/Symptoms: Your throat may feel dry or sore from the anesthesia or the breathing tube placed in your throat during surgery. If this causes discomfort, gargle with warm salt water. The discomfort should disappear within 24 hours.  If you had a scopolamine patch placed behind your ear for the management of post- operative nausea and/or vomiting:  1. The medication in the patch is effective for 72 hours, after which it should be removed.  Wrap patch in a tissue and discard in the trash. Wash hands thoroughly with soap and water. 2. You may remove the patch earlier than 72 hours if you experience unpleasant side effects which may include dry mouth, dizziness or visual disturbances. 3. Avoid touching the patch. Wash your hands with soap and water after contact with the patch.  No tylenol  until after 3:30pm today.  No ibuprofen until after 5:47pm today.    "

## 2024-04-20 NOTE — Anesthesia Postprocedure Evaluation (Signed)
"   Anesthesia Post Note  Patient: George Walsh  Procedure(s) Performed: CARPAL TUNNEL RELEASE (Left: Wrist)     Patient location during evaluation: PACU Anesthesia Type: MAC Level of consciousness: awake and alert Pain management: pain level controlled Vital Signs Assessment: post-procedure vital signs reviewed and stable Respiratory status: spontaneous breathing, nonlabored ventilation and respiratory function stable Cardiovascular status: blood pressure returned to baseline and stable Postop Assessment: no apparent nausea or vomiting Anesthetic complications: no   No notable events documented.  Last Vitals:  Vitals:   04/20/24 1015 04/20/24 1036  BP: (!) 144/78 (!) 148/76  Pulse: (!) 56 (!) 51  Resp: 13 16  Temp:  (!) 36.2 C  SpO2: 93% 96%    Last Pain:  Vitals:   04/20/24 1036  TempSrc:   PainSc: 0-No pain                 Butler Levander Pinal      "

## 2024-04-20 NOTE — Interval H&P Note (Signed)
 History and Physical Interval Note:  04/20/2024 9:06 AM  George Walsh  has presented today for surgery, with the diagnosis of Cubital tunnel syndrome on left.  The various methods of treatment have been discussed with the patient and family. After consideration of risks, benefits and other options for treatment, the patient has consented to  Procedures: CARPAL TUNNEL RELEASE (Left) as a surgical intervention.  The patient's history has been reviewed, patient examined, no change in status, stable for surgery.  I have reviewed the patient's chart and labs.  Questions were answered to the patient's satisfaction.     Eline Geng

## 2024-04-20 NOTE — H&P (Signed)
 " HAND SURGERY   HPI: Patient is a 84 y.o. 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  Patient denies any changes to their medical history or new systemic symptoms today.    Past Medical History:  Diagnosis Date   AAA (abdominal aortic aneurysm)    Arthritis    Cancer (HCC)    skin cancer   Cataract    Color blind    Coronary artery disease    11/05/07 ([facility]): DES pLAD, severe DIAG stenosis not amenabale to PCI given small size, occluded RCA with left-to-right collaterals, unsuccessful attempt at PCI   GERD (gastroesophageal reflux disease)    Hyperlipidemia    Hypertension    Past Surgical History:  Procedure Laterality Date   ABDOMINAL AORTIC ENDOVASCULAR STENT GRAFT N/A 01/27/2020   Procedure: ABDOMINAL AORTIC ENDOVASCULAR STENT GRAFT;  Surgeon: Oris Krystal FALCON, MD;  Location: [facility] OR;  Service: Vascular;  Laterality: N/A;   ARTERY REPAIR Left 01/27/2020   Procedure: ARTERY REPAIR-LEFT FEMORAL ARTERY;  Surgeon: Oris Krystal FALCON, MD;  Location: [facility] OR;  Service: Vascular;  Laterality: Left;   CARPAL TUNNEL RELEASE Right 06/03/2023   Procedure: CARPAL TUNNEL RELEASE;  Surgeon: Romona Harari, MD;  Location: [facility];  Service: Orthopedics;  Laterality: Right;   CATARACT EXTRACTION, BILATERAL Bilateral 2017   COLONOSCOPY W/ BIOPSIES     CYSTOSCOPY N/A 01/27/2020   Procedure: CYSTOSCOPY with difficult foley catheter placement over a wire;  Surgeon: Selma Donnice SAUNDERS, MD;  Location: [facility] OR;  Service: Urology;  Laterality: N/A;   EYE SURGERY Bilateral    cataracts   OTHER SURGICAL HISTORY     stent placed unsure exactly where   OTHER SURGICAL HISTORY     Stent - BMS single   ULTRASOUND GUIDANCE FOR VASCULAR ACCESS Bilateral 01/27/2020   Procedure: ULTRASOUND GUIDANCE FOR VASCULAR ACCESS;  Surgeon: Oris Krystal FALCON, MD;  Location: [facility] OR;  Service: Vascular;  Laterality: Bilateral;   Social History   Socioeconomic History    Marital status: Married    Spouse name: Not on file   Number of children: Not on file   Years of education: Not on file   Highest education level: Not on file  Occupational History   Not on file  Tobacco Use   Smoking status: Never   Smokeless tobacco: Never  Vaping Use   Vaping status: Never Used  Substance and Sexual Activity   Alcohol use: Never   Drug use: Never   Sexual activity: Yes  Other Topics Concern   Not on file  Social History Narrative   Not on file   Social Drivers of Health   Tobacco Use: Low Risk (04/20/2024)   Patient History    Smoking Tobacco Use: Never    Smokeless Tobacco Use: Never    Passive Exposure: Not on file  Financial Resource Strain: Not on file  Food Insecurity: Not on file  Transportation Needs: Not on file  Physical Activity: Not on file  Stress: Not on file  Social Connections: Unknown (07/29/2021)   [facility]   Social Network    Social Network: Not on file  Depression (PHQ2-9): Not on file  Alcohol Screen: Not on file  Housing: Not on file  Utilities: Not on file  Health Literacy: Not on file   History reviewed. No pertinent family history. - negative except otherwise stated in the family history section Allergies[1] Prior to Admission medications  Medication Sig Start  Date End Date Taking? Authorizing Provider  aspirin  81 MG EC tablet Take 81 mg by mouth daily.    Yes [provider]  carvedilol  (COREG ) 25 MG tablet Take 25 mg by mouth 2 (two) times daily with a meal.  11/20/17  Yes [provider]  celecoxib (CELEBREX) 200 MG capsule Take 200 mg by mouth 2 (two) times daily.   Yes [provider]  losartan (COZAAR) 100 MG tablet Take 100 mg by mouth daily.  06/01/19  Yes [provider]  pantoprazole  (PROTONIX ) 40 MG tablet Take 40 mg by mouth daily.  11/30/19  Yes [provider]  pravastatin  (PRAVACHOL ) 40 MG tablet Take 40 mg by mouth daily.  08/05/19  Yes [provider]   No results found. - Positive ROS: All other systems have been reviewed and were otherwise negative with the exception of those mentioned in the HPI and as above.  Physical Exam: General: No acute distress, resting comfortably Cardiovascular: BUE warm and well perfused, normal rate Respiratory: Normal WOB on RA Skin: Warm and dry Neurologic: Sensation intact distally Psychiatric: Patient is at baseline mood and affect  Left Upper Extremity  No swelling or discoloration. He has a strongly positive Tinel sign in the wrist and palm. He has a strongly positive Tinel sign at the medial aspect of the elbow with ulnar nerve instability. He has no intrinsic or thenar atrophy. He has full active range of motion of the elbow, forearm, wrist, and fingers. Sensation is intact to light touch throughout the hand. All fingers are pink and well-perfused.  Assessment: 84 yo M w/ left carpal and cubital tunnel syndromes.  He has declined cubital tunnel release but would like to proceed with left carpal tunnel release.   Plan: OR today for left carpal tunnel release. We again reviewed the risks of surgery which include bleeding, infection, damage to neurovascular structures, persistent symptoms, pillar pain or scar sensitivity, need for additional surgery.  Informed consent was signed.  All questions were answered.   Bebe Galla, M.D. [facility] 9:04 AM     [1] No Known Allergies  "

## 2024-04-21 ENCOUNTER — Encounter (HOSPITAL_BASED_OUTPATIENT_CLINIC_OR_DEPARTMENT_OTHER): Payer: Self-pay | Admitting: Orthopedic Surgery

## 2024-07-04 ENCOUNTER — Ambulatory Visit

## 2024-07-04 ENCOUNTER — Ambulatory Visit (HOSPITAL_COMMUNITY)
